# Patient Record
Sex: Female | Born: 1985 | Hispanic: Yes | Marital: Single | Smoking: Never smoker
Health system: Southern US, Community
[De-identification: ages and names within clinical notes are randomized; demographics above are authoritative.]

## PROBLEM LIST (undated history)

## (undated) ENCOUNTER — Inpatient Hospital Stay (HOSPITAL_COMMUNITY): Payer: Self-pay

## (undated) DIAGNOSIS — Z789 Other specified health status: Secondary | ICD-10-CM

## (undated) HISTORY — PX: NO PAST SURGERIES: SHX2092

---

## 2016-06-30 ENCOUNTER — Encounter (HOSPITAL_COMMUNITY): Payer: Self-pay | Admitting: *Deleted

## 2016-06-30 ENCOUNTER — Inpatient Hospital Stay (HOSPITAL_COMMUNITY)
Admission: AD | Admit: 2016-06-30 | Discharge: 2016-07-01 | Disposition: A | Discharge status: Home / Self Care | Payer: Self-pay | Source: Ambulatory Visit | Attending: Family Medicine | Admitting: Family Medicine

## 2016-06-30 DIAGNOSIS — O209 Hemorrhage in early pregnancy, unspecified: Secondary | ICD-10-CM

## 2016-06-30 DIAGNOSIS — Z3A12 12 weeks gestation of pregnancy: Secondary | ICD-10-CM | POA: Insufficient documentation

## 2016-06-30 DIAGNOSIS — O208 Other hemorrhage in early pregnancy: Secondary | ICD-10-CM | POA: Insufficient documentation

## 2016-06-30 DIAGNOSIS — O418X10 Other specified disorders of amniotic fluid and membranes, first trimester, not applicable or unspecified: Secondary | ICD-10-CM

## 2016-06-30 DIAGNOSIS — O468X1 Other antepartum hemorrhage, first trimester: Secondary | ICD-10-CM

## 2016-06-30 NOTE — MAU Note (Signed)
PT WENT  TO [facility]  ON 8-25  FOR  BLEEDING    TOLD  PT  SHE WAS PREG-   HAD U/S     HAD - TWINS  - BUT    1  NO FHR.      SAYS IS  BLEEDING  NOW-  WORSE   .  IN TRIAGE  -  PAD  SMALL  LIGHT  RED  STREAK  .  FEELS  CRAMPING -   STARTED   9PM.      DID NOT  GO BACK  TO [facility] - [facility] -  DID NOT  LIKE  TREATMENT  .

## 2016-07-01 ENCOUNTER — Inpatient Hospital Stay (HOSPITAL_COMMUNITY): Payer: Self-pay

## 2016-07-01 ENCOUNTER — Encounter (HOSPITAL_COMMUNITY): Payer: Self-pay | Admitting: Advanced Practice Midwife

## 2016-07-01 DIAGNOSIS — O468X1 Other antepartum hemorrhage, first trimester: Secondary | ICD-10-CM

## 2016-07-01 LAB — URINALYSIS, ROUTINE W REFLEX MICROSCOPIC
BILIRUBIN URINE: NEGATIVE
Glucose, UA: NEGATIVE mg/dL
Ketones, ur: NEGATIVE mg/dL
Leukocytes, UA: NEGATIVE
NITRITE: NEGATIVE
PH: 6 (ref 5.0–8.0)
Protein, ur: NEGATIVE mg/dL
SPECIFIC GRAVITY, URINE: 1.005 (ref 1.005–1.030)

## 2016-07-01 LAB — CBC
HEMATOCRIT: 35.1 % — AB (ref 36.0–46.0)
Hemoglobin: 12.5 g/dL (ref 12.0–15.0)
MCH: 31.3 pg (ref 26.0–34.0)
MCHC: 35.6 g/dL (ref 30.0–36.0)
MCV: 88 fL (ref 78.0–100.0)
PLATELETS: 270 10*3/uL (ref 150–400)
RBC: 3.99 MIL/uL (ref 3.87–5.11)
RDW: 13 % (ref 11.5–15.5)
WBC: 9.1 10*3/uL (ref 4.0–10.5)

## 2016-07-01 LAB — URINE MICROSCOPIC-ADD ON

## 2016-07-01 LAB — WET PREP, GENITAL
CLUE CELLS WET PREP: NONE SEEN
Sperm: NONE SEEN
TRICH WET PREP: NONE SEEN
WBC, Wet Prep HPF POC: NONE SEEN
YEAST WET PREP: NONE SEEN

## 2016-07-01 LAB — ABO/RH: ABO/RH(D): O POS

## 2016-07-01 MED ORDER — PRENATAL VITAMIN 27-0.8 MG PO TABS: 1.0000 | ORAL_TABLET | Freq: Every day | ORAL | 12 refills | Status: AC

## 2016-07-01 NOTE — MAU Provider Note (Signed)
Chief Complaint: Vaginal Bleeding   First Provider Initiated Contact with Patient 07/01/16 0031     SUBJECTIVE HPI: Nancy Bartlett is a 30 y.o. G3P2002 at approximately 12 weeks 4 days (per patient per ultrasound at [facility])  who presents to Maternity Admissions reporting heavy vaginal bleeding and mild cramping. States she was seen at [facility] on 06/19/2016 for vaginal bleeding and had an ultrasound showing one live baby and one empty gestational sac. Started having the heavy vaginal bleeding tonight and past a large clot. Isn't sure she passed any tissue. Worried that she may have miscarried.  Location: Suprapubic Quality: Cramping Severity: Mild Duration: Several hours Course: Improving Timing: Intermittent Modifying factors: Nothing. Hasn't tried anything to treat the pain. Improving spontaneously. Associated signs and symptoms: Positive for vaginal bleeding and passing clots. Negative for fever, chills, urinary complaints, GI complaints. Unsure if she has passed any tissue.  History reviewed. No pertinent past medical history. OB History  Gravida Para Term Preterm AB Living  3 2 2     2   SAB TAB Ectopic Multiple Live Births          2    # Outcome Date GA Lbr Len/2nd Weight Sex Delivery Anes PTL Lv  3 Current           2 Term      Vag-Spont   LIV  1 Term      Vag-Spont   LIV     History reviewed. No pertinent surgical history. Social History   Social History  . Marital status: Single    Spouse name: N/A  . Number of children: N/A  . Years of education: N/A   Occupational History  . Not on file.   Social History Main Topics  . Smoking status: Never Smoker  . Smokeless tobacco: Never Used  . Alcohol use No  . Drug use: No  . Sexual activity: Yes    Birth control/ protection: None   Other Topics Concern  . Not on file   Social History Narrative  . No narrative on file   No current facility-administered medications on file prior to encounter.    No  current outpatient prescriptions on file prior to encounter.   No Known Allergies  I have reviewed the past Medical Hx, Surgical Hx, Social Hx, Allergies and Medications.   Review of Systems  OBJECTIVE Patient Vitals for the past 24 hrs:  BP Temp Temp src Pulse Resp Height Weight  06/30/16 2300 116/79 98.3 F (36.8 C) Oral 81 20 5\' 2"  (1.575 m) 150 lb (68 kg)   Constitutional: Well-developed, well-nourished female in no acute distress. Anxious. Cardiovascular: normal rate Respiratory: normal rate and effort.  GI: Abd soft, non-tender. Gravid, size equals dates. MS: Extremities nontender, no edema, normal ROM Neurologic: Alert and oriented x 4.  GU:  SPECULUM EXAM: NEFG, small amount of dark red blood noted, cervix clean  BIMANUAL: cervix closed; uterus 14-week size, no adnexal tenderness or masses.  No CMT. Bladder tender.  LAB RESULTS Results for orders placed or performed during the hospital encounter of 06/30/16 (from the past 24 hour(s))  Urinalysis, Routine w reflex microscopic (not at [facility])     Status: Abnormal   Collection Time: 06/30/16 11:02 PM  Result Value Ref Range   Color, Urine YELLOW YELLOW   APPearance CLEAR CLEAR   Specific Gravity, Urine 1.005 1.005 - 1.030   pH 6.0 5.0 - 8.0   Glucose, UA NEGATIVE NEGATIVE mg/dL   Hgb urine  dipstick LARGE (A) NEGATIVE   Bilirubin Urine NEGATIVE NEGATIVE   Ketones, ur NEGATIVE NEGATIVE mg/dL   Protein, ur NEGATIVE NEGATIVE mg/dL   Nitrite NEGATIVE NEGATIVE   Leukocytes, UA NEGATIVE NEGATIVE  Urine microscopic-add on     Status: Abnormal   Collection Time: 06/30/16 11:02 PM  Result Value Ref Range   Squamous Epithelial / LPF 0-5 (A) NONE SEEN   WBC, UA 0-5 0 - 5 WBC/hpf   RBC / HPF 6-30 0 - 5 RBC/hpf   Bacteria, UA FEW (A) NONE SEEN  CBC     Status: Abnormal   Collection Time: 07/01/16 12:40 AM  Result Value Ref Range   WBC 9.1 4.0 - 10.5 K/uL   RBC 3.99 3.87 - 5.11 MIL/uL   Hemoglobin 12.5 12.0 - 15.0 g/dL   HCT  62.935.1 (L) 52.836.0 - 46.0 %   MCV 88.0 78.0 - 100.0 fL   MCH 31.3 26.0 - 34.0 pg   MCHC 35.6 30.0 - 36.0 g/dL   RDW 41.313.0 24.411.5 - 01.015.5 %   Platelets 270 150 - 400 K/uL  ABO/Rh     Status: None (Preliminary result)   Collection Time: 07/01/16 12:40 AM  Result Value Ref Range   ABO/RH(D) O POS     IMAGING [place] Ob Comp Less 14 Wks  Result Date: 07/01/2016 CLINICAL DATA:  30 year old pregnant female with vaginal bleeding EXAM: OBSTETRIC <14 WK US AND TRANSVAGINAL OB US TECHNIQUE: Both transabdominal and transvaginal ultrasound examinations were performed for complete evaluation of the gestation as well as the maternal uterus, adnexal regions, and pelvic cul-de-sac. Transvaginal technique was performed to assess early pregnancy. COMPARISON:  None for this pregnancy FINDINGS: Intrauterine gestational sac: Single intrauterine gestational sac Yolk sac:  Not seen Embryo:  Present Cardiac Activity: Detected Heart Rate: 153  bpm CRL:  7  mm   13 w   0 d                  US EDC: 01/06/2017 Subchorionic hemorrhage: There is a 3.4 x 1.8 x 3.2 cm subchorionic hemorrhage to the left and inferior aspect of the gestational sac. Maternal uterus/adnexae: There are 2 intramural fibroids in the inferior aspect of the uterus measuring up to 1.5 x 1.1 cm. The maternal ovaries appear unremarkable. There is a 1.3 x 1.0 x 1.2 cm complex cyst or corpus luteum in the right ovary. IMPRESSION: Single live intrauterine pregnancy with an estimated gestational age of [redacted] weeks, 0 days. Moderate subchorionic hemorrhage.  Follow-up recommended. Electronically Signed   By: Elgie CollardArash  Radparvar M.D.   On: 07/01/2016 01:35   [place] Ob Transvaginal  Result Date: 07/01/2016 CLINICAL DATA:  30 year old pregnant female with vaginal bleeding EXAM: OBSTETRIC <14 WK US AND TRANSVAGINAL OB US TECHNIQUE: Both transabdominal and transvaginal ultrasound examinations were performed for complete evaluation of the gestation as well as the maternal uterus, adnexal  regions, and pelvic cul-de-sac. Transvaginal technique was performed to assess early pregnancy. COMPARISON:  None for this pregnancy FINDINGS: Intrauterine gestational sac: Single intrauterine gestational sac Yolk sac:  Not seen Embryo:  Present Cardiac Activity: Detected Heart Rate: 153  bpm CRL:  7  mm   13 w   0 d                  US EDC: 01/06/2017 Subchorionic hemorrhage: There is a 3.4 x 1.8 x 3.2 cm subchorionic hemorrhage to the left and inferior aspect of the gestational sac. Maternal uterus/adnexae: There are 2  intramural fibroids in the inferior aspect of the uterus measuring up to 1.5 x 1.1 cm. The maternal ovaries appear unremarkable. There is a 1.3 x 1.0 x 1.2 cm complex cyst or corpus luteum in the right ovary. IMPRESSION: Single live intrauterine pregnancy with an estimated gestational age of [redacted] weeks, 0 days. Moderate subchorionic hemorrhage.  Follow-up recommended. Electronically Signed   By: Elgie Collard M.D.   On: 07/01/2016 01:35    MAU COURSE Previous ultrasound Records unavailable for review . CBC, ABO/Rh, ultrasound, wet prep and GC/chlamydia culture, UA  MDM - Pain and bleeding in early pregnancy with normal intrauterine pregnancy and hemodynamically stable. Symptoms likely due to subchorionic hemorrhage. No evidence of second gestational sac on today's ultrasound. Unsure if one was truly visualized or if subchorionic hemorrhage may have been mistaken for second sac.  ASSESSMENT 1. Subchorionic hemorrhage in first trimester   2. Vaginal bleeding in pregnancy, first trimester     PLAN Discharge home in stable condition. Bleeding precautions Pelvic rest 1 week. Comfort measures. Rx prenatal vitamin. GC/Chlamydia cultures pending. Will culture urine. Follow-up Information    Obstetrician of your choice .   Why:  Start prenatal care       [facility] [place] [facility] ADMISSIONS .   Why:  As needed in emergencies Contact information: [address] [id number] mc [place] [address] [phone]           Medication List    TAKE these medications   Prenatal Vitamin 27-0.8 MG Tabs Take 1 tablet by mouth daily.        [place], CNM 07/01/2016  3:08 AM  4

## 2016-07-01 NOTE — Discharge Instructions (Signed)
Hematoma subcoriónico °(Subchorionic Hematoma) °Un hematoma subcoriónico es una acumulación de sangre entre la pared externa de la placenta y la pared interna del la matriz (útero). La placenta es el órgano que conecta el feto a la pared del útero. La placenta realiza la función de alimentación, respiración (oxígeno al feto) y el trabajo de eliminación de desechos (excreción) del feto.  °Un hematoma subcoriónico es la anormalidad más frecuente encontrada en una ecografía durante el primer trimestre o principios del segundo trimestre del embarazo. Si ha habido poca o ninguna hemorragia vaginal, generalmente los pequeños hematomas se reducen por su propia cuenta y no afectan al bebé ni al embarazo. La sangre es absorbida gradualmente durante una o dos semanas. Cuando la hemorragia comienza más tarde en el embarazo o el hematoma es más grande o se produce en una paciente de edad avanzada, el resultado puede no ser tan bueno. Los grandes hematomas pueden agrandarse aún más y aumenta las posibilidades de aborto espontáneo. El hematoma subcoriónico también aumenta el riesgo de desprendimiento precoz de la placenta del útero, muerte fetal y parto prematuro. °INSTRUCCIONES PARA EL CUIDADO EN EL HOGAR  °· Repose en cama si el médico se lo recomienda. Aunque el reposo en cama no evitará la hemorragia o un aborto espontáneo, su médico puede recomendarlo. °· Evite levantar objetos pesados (más de 10 libras [4,5 kg]), hacer ejercicio, tener relaciones sexuales o realizar duchas vaginales según se lo indique el profesional. °· Lleve un registro de la cantidad y grado de remojo (saturación) de las toallas higiénicas que utiliza cada día. Anote esta información. °· No use tampones. °· Cumpla con todas las visitas de control, según le indique su médico. El profesional podrá pedirle que se realice análisis de seguimiento, pruebas de ultrasonido o ambas. °SOLICITE ATENCIÓN MÉDICA DE INMEDIATO SI:  °· Siente calambres intensos en el  estómago, en la espalda, en el abdomen o en la pelvis. °· Tiene fiebre. °· Elimina coágulos o tejidos grandes. Guarde los tejidos para que su médico los vea. °· Si la hemorragia aumenta o siente mareos, debilidad o tiene episodios de desmayos. °  °Esta información no tiene como fin reemplazar el consejo del médico. Asegúrese de hacerle al médico cualquier pregunta que tenga. °  °Document Released: 01/28/2009 Document Revised: 08/02/2013 °Elsevier Interactive Patient Education ©2016 Elsevier Inc. ° °Reposo pélvico  °(Pelvic Rest) °El reposo pélvico se recomienda a las mujeres cuando:  °· La placenta cubre parcial o completamente la abertura del cuello del útero (placenta previa). °· Hay sangrado entre la pared del útero y el saco amniótico en el primer trimestre (hemorragia subcoriónica). °· El cuello uterino comienza a abrirse sin iniciarse el trabajo de parto (cuello uterino incompetente, insuficiencia cervical). °· El trabajo de parto se inicia muy pronto (parto prematuro). °INSTRUCCIONES PARA EL CUIDADO EN EL HOGAR  °· No tenga relaciones sexuales, estimulación, ni orgasmos. °· No use tampones, no se haga duchas vaginales ni coloque ningún objeto en la vagina. °· No levante objetos que pesen más de 10 libras (4,5 kg). °· Evite las actividades extenuantes o tensionar los músculos de la pelvis. °SOLICITE ATENCIÓN MÉDICA SI:   °· Tiene un sangrado vaginal durante el embarazo. Considérelo como una posible emergencia. °· Siente cólicos en la zona baja del estómago (más fuertes que los cólicos menstruales). °· Nota flujo vaginal (acuoso, con moco o sangre). °· Siente un dolor en la espalda leve y sordo. °· Tiene contracciones regulares o endurecimiento del útero. °SOLICITE ATENCIÓN MÉDICA DE INMEDIATO SI:  °Observa sangrado   vaginal y tiene placenta previa.  °  °Esta información no tiene como fin reemplazar el consejo del médico. Asegúrese de hacerle al médico cualquier pregunta que tenga. °  °Document Released:  07/06/2012 °Elsevier Interactive Patient Education ©2016 Elsevier Inc. ° °

## 2016-07-02 LAB — GC/CHLAMYDIA PROBE AMP (~~LOC~~) NOT AT ARMC
CHLAMYDIA, DNA PROBE: NEGATIVE
Neisseria Gonorrhea: NEGATIVE

## 2016-07-03 LAB — OB RESULTS CONSOLE HIV ANTIBODY (ROUTINE TESTING): HIV: NONREACTIVE

## 2016-07-03 LAB — OB RESULTS CONSOLE HEPATITIS B SURFACE ANTIGEN: HEP B S AG: NEGATIVE

## 2016-07-03 LAB — OB RESULTS CONSOLE RPR: RPR: NONREACTIVE

## 2016-07-03 LAB — OB RESULTS CONSOLE ANTIBODY SCREEN: Antibody Screen: NEGATIVE

## 2016-07-03 LAB — OB RESULTS CONSOLE RUBELLA ANTIBODY, IGM: RUBELLA: IMMUNE

## 2016-07-03 LAB — OB RESULTS CONSOLE ABO/RH: RH TYPE: POSITIVE

## 2016-08-18 ENCOUNTER — Observation Stay (HOSPITAL_COMMUNITY)
Admission: AD | Admit: 2016-08-18 | Discharge: 2016-08-20 | Disposition: A | Discharge status: Home / Self Care | Payer: Medicaid Other | Source: Ambulatory Visit | Attending: Obstetrics & Gynecology | Admitting: Obstetrics & Gynecology

## 2016-08-18 ENCOUNTER — Encounter (HOSPITAL_COMMUNITY): Payer: Self-pay

## 2016-08-18 ENCOUNTER — Ambulatory Visit (HOSPITAL_COMMUNITY)
Admission: RE | Admit: 2016-08-18 | Discharge: 2016-08-18 | Disposition: A | Discharge status: Home / Self Care | Payer: Medicaid Other | Source: Ambulatory Visit | Attending: Specialist | Admitting: Specialist

## 2016-08-18 ENCOUNTER — Other Ambulatory Visit (HOSPITAL_COMMUNITY): Payer: Self-pay | Admitting: Specialist

## 2016-08-18 ENCOUNTER — Encounter (HOSPITAL_COMMUNITY): Payer: Self-pay | Admitting: *Deleted

## 2016-08-18 DIAGNOSIS — O0339 Incomplete spontaneous abortion with other complications: Secondary | ICD-10-CM | POA: Diagnosis not present

## 2016-08-18 DIAGNOSIS — O200 Threatened abortion: Secondary | ICD-10-CM | POA: Diagnosis present

## 2016-08-18 DIAGNOSIS — O42912 Preterm premature rupture of membranes, unspecified as to length of time between rupture and onset of labor, second trimester: Secondary | ICD-10-CM | POA: Diagnosis present

## 2016-08-18 DIAGNOSIS — O99820 Streptococcus B carrier state complicating pregnancy: Secondary | ICD-10-CM

## 2016-08-18 DIAGNOSIS — Z363 Encounter for antenatal screening for malformations: Secondary | ICD-10-CM | POA: Insufficient documentation

## 2016-08-18 DIAGNOSIS — O4102X0 Oligohydramnios, second trimester, not applicable or unspecified: Secondary | ICD-10-CM

## 2016-08-18 DIAGNOSIS — O42012 Preterm premature rupture of membranes, onset of labor within 24 hours of rupture, second trimester: Secondary | ICD-10-CM

## 2016-08-18 DIAGNOSIS — Z3A19 19 weeks gestation of pregnancy: Secondary | ICD-10-CM | POA: Insufficient documentation

## 2016-08-18 DIAGNOSIS — O42919 Preterm premature rupture of membranes, unspecified as to length of time between rupture and onset of labor, unspecified trimester: Secondary | ICD-10-CM

## 2016-08-18 DIAGNOSIS — Z1389 Encounter for screening for other disorder: Secondary | ICD-10-CM

## 2016-08-18 DIAGNOSIS — N939 Abnormal uterine and vaginal bleeding, unspecified: Secondary | ICD-10-CM | POA: Diagnosis present

## 2016-08-18 HISTORY — DX: Other specified health status: Z78.9

## 2016-08-18 LAB — WET PREP, GENITAL
CLUE CELLS WET PREP: NONE SEEN
Sperm: NONE SEEN
Trich, Wet Prep: NONE SEEN
YEAST WET PREP: NONE SEEN

## 2016-08-18 LAB — CBC
HEMATOCRIT: 37.9 % (ref 36.0–46.0)
Hemoglobin: 13.2 g/dL (ref 12.0–15.0)
MCH: 31.1 pg (ref 26.0–34.0)
MCHC: 34.8 g/dL (ref 30.0–36.0)
MCV: 89.2 fL (ref 78.0–100.0)
PLATELETS: 276 10*3/uL (ref 150–400)
RBC: 4.25 MIL/uL (ref 3.87–5.11)
RDW: 13.2 % (ref 11.5–15.5)
WBC: 8.3 10*3/uL (ref 4.0–10.5)

## 2016-08-18 LAB — TYPE AND SCREEN
ABO/RH(D): O POS
ANTIBODY SCREEN: NEGATIVE

## 2016-08-18 LAB — RAPID HIV SCREEN (HIV 1/2 AB+AG)
HIV 1/2 Antibodies: NONREACTIVE
HIV-1 P24 Antigen - HIV24: NONREACTIVE

## 2016-08-18 LAB — KLEIHAUER-BETKE STAIN
# VIALS RHIG: 1
Fetal Cells %: 0 %
QUANTITATION FETAL HEMOGLOBIN: 0 mL

## 2016-08-18 MED ORDER — LACTATED RINGERS IV SOLN: INTRAVENOUS | Status: DC

## 2016-08-18 MED ORDER — OXYTOCIN BOLUS FROM INFUSION: 500.0000 mL | Freq: Once | INTRAVENOUS | Status: DC

## 2016-08-18 MED ORDER — OXYTOCIN 40 UNITS IN LACTATED RINGERS INFUSION - SIMPLE MED
2.5000 [IU]/h | INTRAVENOUS | Status: DC
  Filled 2016-08-18: qty 1000

## 2016-08-18 MED ORDER — FENTANYL CITRATE (PF) 100 MCG/2ML IJ SOLN
50.0000 ug | INTRAMUSCULAR | Status: AC | PRN
  Administered 2016-08-18 – 2016-08-19 (×3): 50 ug via INTRAVENOUS
  Filled 2016-08-18 (×3): qty 2

## 2016-08-18 MED ORDER — FENTANYL CITRATE (PF) 100 MCG/2ML IJ SOLN
50.0000 ug | Freq: Once | INTRAMUSCULAR | Status: AC
  Administered 2016-08-18: 50 ug via INTRAVENOUS
  Filled 2016-08-18: qty 2

## 2016-08-18 MED ORDER — LACTATED RINGERS IV SOLN: 500.0000 mL | INTRAVENOUS | Status: DC | PRN

## 2016-08-18 MED ORDER — LIDOCAINE HCL (PF) 1 % IJ SOLN
30.0000 mL | INTRAMUSCULAR | Status: DC | PRN
  Filled 2016-08-18: qty 30

## 2016-08-18 MED ORDER — SOD CITRATE-CITRIC ACID 500-334 MG/5ML PO SOLN: 30.0000 mL | ORAL | Status: DC | PRN

## 2016-08-18 MED ORDER — ONDANSETRON HCL 4 MG/2ML IJ SOLN: 4.0000 mg | Freq: Four times a day (QID) | INTRAMUSCULAR | Status: DC | PRN

## 2016-08-18 MED ORDER — LACTATED RINGERS IV SOLN
INTRAVENOUS | Status: DC
  Administered 2016-08-18 (×2): via INTRAVENOUS

## 2016-08-18 NOTE — MAU Note (Signed)
Pt brought to MAU from MFM; diagnosed with PPROM yesterday; having heavy bleeding;

## 2016-08-18 NOTE — H&P (Signed)
Obstetrics Admission History & Physical  08/18/2016 - 3:02 PM   Late entry for 1338  Primary OBGYN: Other Dr. Shawnie Ponsorn [facility]  Chief Complaint: VB, abdominal and low back discomfort, anhydramnios  History of Present Illness  30 y.o. [id number] @ 2018w6d, with the above CC. Pregnancy complicated by: vaginal bleeding and spotting during the pregnancy.  Patient is with Dr. Tawni Levyorn's practice and part of the HPI was obtained from her and her family and Dr. Shawnie Ponsorn, from talking to him. He states that she has been seen at multiple hospital in the region for continued VB and spotting and had been followed closely by his practice and was diagnosed with PPROM yesterday on spec exam and u/s; she had declined any genetic screening. She states she was put on abx by Dr. Shawnie Ponsorn yesterday. Given the diagnosis, she was referred to MFM for u/s today, which she obtained earlier today.  MFM u/s today shows SLIUP with anhydramnios and anterior placenta and no previa; a formal anatomy scan wasn't done. Dr. Claudean SeveranceWhitecar referred her to MAU for further management given to the fetus (cephalic) abutting the internal os. He stated it was hard to tell about the cervix.  On presentation to MAU, patient was having some abdominal and low back discomfort with VB. On speculum exam, small to moderate amount of blood cleared out with two fox swabs and cervix was digitally dilated to 2cm per Judeth HornErin Lawrence NP  Currently, patient denies any abdominal or back pain and she just changed her pad. She also declines any fevers, chills, chest pain, SOB or uterine contractions.   Review of Systems: her 12 point review of systems is negative or as noted in the History of Present Illness.   PMHx:  Past Medical History:  Diagnosis Date  . Medical history non-contributory    PSHx:  Past Surgical History:  Procedure Laterality Date  . NO PAST SURGERIES     Medications:  Prescriptions Prior to Admission  Medication Sig Dispense Refill Last Dose   . acetaminophen (TYLENOL) 500 MG tablet Take 1,000 mg by mouth every 6 (six) hours as needed for mild pain, moderate pain or headache.   Past Week at Unknown time  . amoxicillin (AMOXIL) 500 MG capsule Take 500 mg by mouth 3 (three) times daily.   08/18/2016 at Unknown time  . Prenatal Vit-Fe Fumarate-FA (PRENATAL VITAMIN) 27-0.8 MG TABS Take 1 tablet by mouth daily. 30 tablet 12 08/17/2016 at Unknown time     Allergies: has No Known Allergies. OBHx:  OB History  Gravida Para Term Preterm AB Living  3 2 2     2   SAB TAB Ectopic Multiple Live Births          2    # Outcome Date GA Lbr Len/2nd Weight Sex Delivery Anes PTL Lv  3 Current           2 Term      Vag-Spont   LIV  1 Term      Vag-Spont   LIV              FHx:  Family History  Problem Relation Age of Onset  . Alcohol abuse Neg Hx   . Arthritis Neg Hx   . Asthma Neg Hx   . Birth defects Neg Hx   . Cancer Neg Hx   . COPD Neg Hx   . Depression Neg Hx   . Diabetes Neg Hx   . Drug abuse Neg Hx   .  Early death Neg Hx   . Hearing loss Neg Hx   . Heart disease Neg Hx   . Hyperlipidemia Neg Hx   . Hypertension Neg Hx   . Kidney disease Neg Hx   . Learning disabilities Neg Hx   . Mental illness Neg Hx   . Mental retardation Neg Hx   . Miscarriages / Stillbirths Neg Hx   . Stroke Neg Hx   . Vision loss Neg Hx   . Varicose Veins Neg Hx    Soc Hx:  Social History   Social History  . Marital status: Single    Spouse name: N/A  . Number of children: N/A  . Years of education: N/A   Occupational History  . Not on file.   Social History Main Topics  . Smoking status: Never Smoker  . Smokeless tobacco: Never Used  . Alcohol use No  . Drug use: No  . Sexual activity: Yes    Birth control/ protection: None   Other Topics Concern  . Not on file   Social History Narrative  . No narrative on file    Objective  There were no vitals filed for this visit. Pulse Rate:  [92] 92 (10/24 0939) BP: (116)/(76)  116/76 (10/24 0939) Weight:  [70.6 kg (155 lb 9.6 oz)] 70.6 kg (155 lb 9.6 oz) (10/24 0939) No data recorded.  No intake or output data in the 24 hours ending 08/18/16 1502   General: Well nourished, well developed female in no acute distress.  Skin:  Warm and dry.  Cardiovascular: S1, S2 normal, no murmur, rub or gallop, regular rate and rhythm Respiratory:  Clear to auscultation bilateral. Normal respiratory effort Abdomen: soft, nttp, gravid Neuro/Psych:  Normal mood and affect.   GU: new pad with approx 4 x 4cm lightly brightly blood stained area. No blood at the introitus and no active bleeding  Labs  O POS  Recent Labs Lab 08/18/16 1250  WBC 8.3  HGB 13.2  HCT 37.9  PLT 276   Negative: KB, HIV, wet prep (only a few WBCs) Pending: RPR, GC/CT  Radiology As above.   Assessment & Plan   30 y.o. [id number] @ [redacted]w[redacted]d with PPROM  *PPROM: long d/w patient re: diagnosis and prognosis and DDx includes infectious (d/w pt was done prior to CBC coming back), vs PTL with inevitable AB. I told her that I suspect that she has an inevitable AB and is PTL and that once labs are back (see above) that I would come talk to her and husband who is en route about plan of care. Regardless, I told her that I'd recommend observation and depending on her course and labs, offer augmentation with FHTs being done every few hours. Pt to stay NPO until disposition is made and labs are back.   Interpreter used  Verlot Bing, Montez Hageman. MD Attending [facility] [employer] [facility])

## 2016-08-18 NOTE — ED Notes (Signed)
Pt reports leaking reddish/clear watery fluid x 3 months.

## 2016-08-18 NOTE — MAU Provider Note (Signed)
History     CSN: [id number]  Arrival date and time: 08/18/16 1055   First Provider Initiated Contact with Patient 08/18/16 1213      Chief Complaint  Patient presents with  . Abdominal Pain  . Rupture of Membranes  . Vaginal Bleeding   HPI Nancy Bartlett is a 30 y.o. G3P2002 at 7963w6d who presents with abdominal pain & vaginal bleeding. [place] [place] [place]. Was complaining of leaking pink tinged fluid for the last 3 months. Was seen by Dr. Shawnie Ponsorn yesterday & told she was ruptured; sent to MFM today for ultrasound. Pt sent to MAU today by MFM for anhydramnios and possible labor.  Patient reports intermittent abdominal pain since last night that has worsened today. Rates pain 8/10. Has no treated pain. Also reports vaginal bleeding since last week that has become heavier today.  Denies fever/chills.   OB History    Gravida Para Term Preterm AB Living   3 2 2     2    SAB TAB Ectopic Multiple Live Births           2      Past Medical History:  Diagnosis Date  . Medical history non-contributory     Past Surgical History:  Procedure Laterality Date  . NO PAST SURGERIES      Family History  Problem Relation Age of Onset  . Alcohol abuse Neg Hx   . Arthritis Neg Hx   . Asthma Neg Hx   . Birth defects Neg Hx   . Cancer Neg Hx   . COPD Neg Hx   . Depression Neg Hx   . Diabetes Neg Hx   . Drug abuse Neg Hx   . Early death Neg Hx   . Hearing loss Neg Hx   . Heart disease Neg Hx   . Hyperlipidemia Neg Hx   . Hypertension Neg Hx   . Kidney disease Neg Hx   . Learning disabilities Neg Hx   . Mental illness Neg Hx   . Mental retardation Neg Hx   . Miscarriages / Stillbirths Neg Hx   . Stroke Neg Hx   . Vision loss Neg Hx   . Varicose Veins Neg Hx     Social History  Substance Use Topics  . Smoking status: Never Smoker  . Smokeless tobacco: Never Used  . Alcohol use No    Allergies: No Known Allergies  Prescriptions  Prior to Admission  Medication Sig Dispense Refill Last Dose  . Prenatal Vit-Fe Fumarate-FA (PRENATAL VITAMIN) 27-0.8 MG TABS Take 1 tablet by mouth daily. 30 tablet 12 Taking    Review of Systems  Constitutional: Negative.   Gastrointestinal: Positive for abdominal pain. Negative for constipation, diarrhea, nausea and vomiting.  Genitourinary: Negative for dysuria.       + vaginal bleeding + LOF -- pink tinged x 3 months   Physical Exam   Last menstrual period 12/24/2015.  Physical Exam  Nursing note and vitals reviewed. Constitutional: She is oriented to person, place, and time. She appears well-developed and well-nourished. No distress.  HENT:  Head: Normocephalic and atraumatic.  Eyes: Conjunctivae are normal. Right eye exhibits no discharge. Left eye exhibits no discharge. No scleral icterus.  Neck: Normal range of motion.  Cardiovascular: Normal rate.   Respiratory: Effort normal. No respiratory distress.  Genitourinary: There is bleeding (small to moderate amount of blood cleared out with 2 fox swabs) in the vagina.  Genitourinary Comments:  Cervix dilated 2 cm  Neurological: She is alert and oriented to person, place, and time.  Skin: Skin is warm and dry. She is not diaphoretic.  Psychiatric: She has a normal mood and affect. Her behavior is normal. Judgment and thought content normal.   Dilation: 2 Effacement (%): 50 Cervical Position: [place]: -3 Exam by:: Judeth Horn NP   MAU Course  Procedures Results for orders placed or performed during the hospital encounter of 08/18/16 (from the past 24 hour(s))  Wet prep, genital     Status: Abnormal   Collection Time: 08/18/16 12:32 PM  Result Value Ref Range   Yeast Wet Prep HPF POC NONE SEEN NONE SEEN   Trich, Wet Prep NONE SEEN NONE SEEN   Clue Cells Wet Prep HPF POC NONE SEEN NONE SEEN   WBC, Wet Prep HPF POC FEW (A) NONE SEEN   Sperm NONE SEEN     MDM Ultrasound shows cardiac activity & anhydramnios.  Fetal head appears in cervix & pt was sent to MAU for possible imminent delivery CBC, HIV, type & screen, RPR, KB Urine culture  GC/Ct & wet prep collected IV started & fentanyl 50 mcg ordered S/w Dr. Vergie Living; will come see pt in MAU with plan to admit to birthing suites  Assessment and Plan  A: 1. Preterm premature rupture of membranes (PPROM) with unknown onset of labor   2. Anhydramnios in second trimester, single or unspecified fetus    P: Dr. Vergie Living in to speak with patient regarding plan to admit   Judeth Horn 08/18/2016, 12:08 PM

## 2016-08-18 NOTE — Progress Notes (Addendum)
L&D Note  08/18/2016 - 4:36 PM  30 y.o. [id number] 10342w6d. Pregnancy complicated by recurrent VB during the pregnancy  Ms. Nancy Bartlett is admitted for pre-viable PPROM management   Subjective:  Feeling some more discomfort (8/10). VB is minimal.  Objective:   Vitals:   08/18/16 1300 08/18/16 1511  BP: 110/70 109/67  Pulse: 86 84  Resp: 18 18  Temp: 98.1 F (36.7 C) 98.2 F (36.8 C)  TempSrc: Oral Oral    Current Vital Signs 24h Vital Sign Ranges  T 98.2 F (36.8 C) Temp  Avg: 98.2 F (36.8 C)  Min: 98.1 F (36.7 C)  Max: 98.2 F (36.8 C)  BP 109/67 BP  Min: 109/67  Max: 116/76  HR 84 Pulse  Avg: 87.3  Min: 84  Max: 92  RR 18 Resp  Avg: 18  Min: 18  Max: 18  SaO2     No Data Recorded       24 Hour I/O Current Shift I/O  Time Ins Outs No intake/output data recorded. No intake/output data recorded.   FHTs: 150s-160s Gen: NAD SVE: 5/75/0 with fetal parts at the os  Labs:   Recent Labs Lab 08/18/16 1250  WBC 8.3  HGB 13.2  HCT 37.9  PLT 276   O POS  Medications Current Facility-Administered Medications  Medication Dose Route Frequency Provider Last Rate Last Dose  . lactated ringers infusion 500-1,000 mL  500-1,000 mL Intravenous PRN [place] Bingharlie Dvora Buitron, MD      . lactated ringers infusion   Intravenous Continuous Judeth HornErin Lawrence, NP 125 mL/hr at 08/18/16 1317    . lactated ringers infusion   Intravenous Continuous [place] Bingharlie Praveen Coia, MD      . lidocaine (PF) (XYLOCAINE) 1 % injection 30 mL  30 mL Subcutaneous PRN [place] Bingharlie Isamar Wellbrock, MD      . ondansetron (ZOFRAN) injection 4 mg  4 mg Intravenous Q6H PRN [place] Bingharlie Dayshawn Irizarry, MD      . oxytocin (PITOCIN) IV BOLUS FROM BAG  500 mL Intravenous Once [place] Bingharlie Candi Profit, MD      . oxytocin (PITOCIN) IV infusion 40 units in LR 1000 mL - Premix  2.5 Units/hr Intravenous Continuous [place] Bingharlie Nastashia Gallo, MD      . sodium citrate-citric acid (ORACIT) solution 30 mL  30 mL Oral Q2H PRN  Bingharlie Avigayil Ton, MD        Assessment & Plan:   Patient with continued cervical change Given cx change, I told her that I again recommend augmentation for inevitable AB. No e/o infection. Husband is on his way  Interpreter used.   Cornelia Copaharlie Jamoni Hewes, Jr. MD Attending Center for [facility](Faculty Practice)   ADDENDUM @ [phone] stituation d/w her and husband and family re: recommendation for augmentation for inevitable AB. Risk of D&C d/w them for POCs and analgesia options also d/w them. Once patient on the floor then we can start her PV cytotec. They are unsure if they'd like to see the baby after delivery.   NPO, MIVF.   Cornelia Copaharlie Giordano Getman, Jr MD Attending Center for [facility](Faculty Practice)

## 2016-08-18 NOTE — Consult Note (Signed)
Maternal Fetal Medicine Consultation  Requesting Provider(s): Arther Abbott, MD  Reason for consultation: suspected mid-trimester PROM  HPI: Nancy Bartlett is a 30 yo G3P2002, EDD 12/24/2015 who is currently at 19w 6d seen due to suspected PROM.  Ms. Kuch reports vaginal bleeding since early in the pregnancy.  She has an early ultrasound in the ED that was felt to be consistent with twin gestation - bleeding up to this point was felt to be secondary to "vanishing twin syndrome".  Ms. Intrieri was seen in clinic yesterday for an anatomy ultrasound - at that time was noted to have low fluid and a "small fetal head".  Sterile speculum exam was performed that was ferning positive.  She was started on po Amoxicillin and referred to MFM today for further evaluation and plans.  The patient reports some lower abdominal, back and vaginal pain.  She denies fever or chills.  She is known GBS positive per clinic records.  The patient's past OB history is remarkable for town previous term SVDs.  Her second pregnancy was complicated by midtrimester vaginal bleeding.  OB History: OB History    Gravida Para Term Preterm AB Living   3 2 2     2    SAB TAB Ectopic Multiple Live Births           2      PMH:  Past Medical History:  Diagnosis Date  . Medical history non-contributory     PSH:  Past Surgical History:  Procedure Laterality Date  . NO PAST SURGERIES     Meds:  Current Outpatient Prescriptions on File Prior to Encounter  Medication Sig Dispense Refill  . AMOXICILLIN PO Take by mouth.    . Prenatal Vit-Fe Fumarate-FA (PRENATAL VITAMIN) 27-0.8 MG TABS Take 1 tablet by mouth daily. 30 tablet 12   No current facility-administered medications on file prior to encounter.    Allergies: No Known Allergies   FH: Denies birth defects or hereditary disorders  Soc:  Social History   Social History  . Marital status: Single    Spouse name: N/A  . Number of children: N/A  . Years of  education: N/A   Occupational History  . Not on file.   Social History Main Topics  . Smoking status: Never Smoker  . Smokeless tobacco: Never Used  . Alcohol use No  . Drug use: No  . Sexual activity: Yes    Birth control/ protection: None   Other Topics Concern  . Not on file   Social History Narrative  . No narrative on file    PE:  155 lbs, 116/76, 92  GEN: well-appearing female ABD: gravid, NT  Ultrasound: Single IUP at 19w 6d by early ultrasound Anhydramnios A bladder ans small stomach bubble are appreciated Limited views of the fetal anatomy were obtained due to oligohydramnios No gross anomalies noted The fetal head appears to be in the cervix Anterior placenta without previa   A/P: 1) Single IUP at 19 6/7 weeks  2) Midtrimester PROM - had brief discussion regarding PROM and potential risks/ guarded prognosis for the fetus.  The patient and her family are aware that the threshold of viability is approximately [redacted] weeks gestation and that there is significant risk of delivery in the next 1-2 weeks (typical period of latency after PROM).  Reviewed risk of intra amniotic infection, placental abruption, risk of pulmonary hypoplasia even if she was able to achieve a gestational age where the fetus could potentially survive.  Given ultrasound findings, with the fetal head at the cervix and some lower abdominal pain I am concerned that she is at risk for imminent delivery - the patient was sent to MAU for further evaluation and will likely need to be admitted for observation.  In the event that the patient remains stable and does not have evidence of infection or at risk for imminent delivery, would offer expect management (if desired).  If she chooses this management option, would consider close outpatient management - check temperature at least 4x daily with labor precautions.  If she is undelivered, would consider admission at 22w 5d for course of betamethasone / NICU  consultation.  In general, I do not recommend latency antibiotics at this previable gestation, but would offer this intervention once viability is achieved.    Thank you for the opportunity to be a part of the care of Apache CorporationLourdes Noyola Woodell. Please contact our office if we can be of further assistance.   I spent approximately 30 minutes with this patient with over 50% of time spent in face-to-face counseling.  Alpha GulaPaul Florita Nitsch, MD Maternal Fetal Medicine

## 2016-08-19 ENCOUNTER — Encounter (HOSPITAL_COMMUNITY): Payer: Self-pay | Admitting: *Deleted

## 2016-08-19 ENCOUNTER — Other Ambulatory Visit (HOSPITAL_COMMUNITY): Payer: Self-pay

## 2016-08-19 DIAGNOSIS — O0339 Incomplete spontaneous abortion with other complications: Secondary | ICD-10-CM

## 2016-08-19 LAB — GC/CHLAMYDIA PROBE AMP (~~LOC~~) NOT AT ARMC
CHLAMYDIA, DNA PROBE: NEGATIVE
Neisseria Gonorrhea: NEGATIVE

## 2016-08-19 LAB — RPR: RPR Ser Ql: NONREACTIVE

## 2016-08-19 MED ORDER — OXYCODONE HCL 5 MG PO TABS: 5.0000 mg | ORAL_TABLET | ORAL | Status: DC | PRN

## 2016-08-19 MED ORDER — FENTANYL CITRATE (PF) 100 MCG/2ML IJ SOLN
100.0000 ug | Freq: Once | INTRAMUSCULAR | Status: AC
  Administered 2016-08-19: 100 ug via INTRAVENOUS
  Filled 2016-08-19: qty 2

## 2016-08-19 MED ORDER — MISOPROSTOL 200 MCG PO TABS
800.0000 ug | ORAL_TABLET | Freq: Once | ORAL | Status: AC
  Administered 2016-08-19: 800 ug via ORAL
  Filled 2016-08-19: qty 4

## 2016-08-19 MED ORDER — MISOPROSTOL 200 MCG PO TABS: 800.0000 ug | ORAL_TABLET | Freq: Once | ORAL | Status: DC | PRN

## 2016-08-19 MED ORDER — ACETAMINOPHEN 325 MG PO TABS
650.0000 mg | ORAL_TABLET | Freq: Four times a day (QID) | ORAL | Status: DC | PRN
Start: 2016-08-19 — End: 2016-08-20
  Administered 2016-08-20: 650 mg via ORAL
  Filled 2016-08-19: qty 2

## 2016-08-19 MED ORDER — BUTORPHANOL TARTRATE 1 MG/ML IJ SOLN
1.0000 mg | INTRAMUSCULAR | Status: DC | PRN
  Administered 2016-08-20: 1 mg via INTRAVENOUS
  Filled 2016-08-19: qty 1

## 2016-08-19 NOTE — Progress Notes (Signed)
Pt was sitting up, awake and alert, in bed when I arrived. Her husband was bedside.  They requested that their baby girl be baptized and called for an interpreter. With the interpreter, CH baptized baby using the Scripture text requested by parents. Pt was very tearful and her husband was very attentive. Both were appreciative. Please page if additional support is needed.  Chaplain Marjory LiesPamela Carrington Holder, M.Div.   08/19/16 1900  Clinical Encounter Type  Visit Type Death

## 2016-08-19 NOTE — Progress Notes (Signed)
Zaydah Pete Glatteroyola Eve is a 30 y.o. G3P2002 at 423w0d by ultrasound admitted for rupture of membranes, anhydramnios  Subjective:Has felt some movement, no bleeding now, few contractions   Objective: BP 102/60 (BP Location: Right Arm)   Pulse 94   Temp 98.6 F (37 C) (Oral)   Resp 16   LMP 12/24/2015   SpO2 100%  I/O last 3 completed shifts: In: -  Out: 350 [Urine:350] No intake/output data recorded.  FHT:  160 UC:   occasional SVE:   Dilation: 2 Effacement (%): 50 Station: -3 Exam by:: Olegario MessierKathy, L&D RROB RN  Labs: Lab Results  Component Value Date   WBC 8.3 08/18/2016   HGB 13.2 08/18/2016   HCT 37.9 08/18/2016   MCV 89.2 08/18/2016   PLT 276 08/18/2016    Assessment / Plan: 3423w0d Bleeding, ROM  Labor: observing for labor Preeclampsia:  no signs or symptoms of toxicity Fetal Wellbeing:  FHR noted, not viable GA Pain Control:  IV pain meds I/D:  n/a Anticipated MOD:  NSVD. Discussed with pt via interpreter that if no labor ori indication to terminate she could go home with close f/u  ARNOLD,JAMES 08/19/2016, 7:10 AM

## 2016-08-19 NOTE — Progress Notes (Signed)
I was present during the delivery with RN Shanda BumpsJessica also I was present during baptism with the Edwena Blowhaplain Pamela, by Orlan LeavensViria Alvarez Spanish Interpreter.

## 2016-08-19 NOTE — Progress Notes (Signed)
Called for interpretor as pt seems to have increased vaginal bleeding on pad that is currently in place. Per patient she had one that was "soaking" earlier today that she did not report and that has already been removed from the room. Pad in trash can seems to have a small amount of bright red blood. Patient states current pad was place two hours ago.  Asked patient to change pad now and interpretor will come back with me in one hour to check and see how much is there on the new pad.

## 2016-08-19 NOTE — Progress Notes (Signed)
Consulted with pt RN Jessica to acknowledge receipt of consult.  Consult note indicated that the patieShanda Bartlett requested baptism after her baby is born, but does not want a visit prior to baby's birth.  RN stated it was her understanding that the patient wanted baptism to take place after baby had been bathed and dressed, but she will further clarify when the transslator arrives.  Will continue to follow. Please page as further needs arise.  Maryanna ShapeAmanda M. Carley Hammedavee Lomax, M.Div. [facility] Chaplain Pager [phone] Office [phone]

## 2016-08-19 NOTE — Progress Notes (Signed)
This nurse delivered a Spanish Bible to patient.

## 2016-08-20 ENCOUNTER — Encounter (HOSPITAL_COMMUNITY)
Admission: AD | Disposition: A | Discharge status: Home / Self Care | Payer: Self-pay | Source: Ambulatory Visit | Attending: Obstetrics and Gynecology

## 2016-08-20 ENCOUNTER — Inpatient Hospital Stay (HOSPITAL_COMMUNITY): Payer: Medicaid Other | Admitting: Anesthesiology

## 2016-08-20 ENCOUNTER — Encounter (HOSPITAL_COMMUNITY): Payer: Self-pay

## 2016-08-20 DIAGNOSIS — O0339 Incomplete spontaneous abortion with other complications: Secondary | ICD-10-CM | POA: Diagnosis not present

## 2016-08-20 HISTORY — PX: DILITATION & CURRETTAGE/HYSTROSCOPY WITH ESSURE: SHX5573

## 2016-08-20 LAB — CBC
HCT: 28.5 % — ABNORMAL LOW (ref 36.0–46.0)
HEMOGLOBIN: 10.1 g/dL — AB (ref 12.0–15.0)
MCH: 31.6 pg (ref 26.0–34.0)
MCHC: 35.4 g/dL (ref 30.0–36.0)
MCV: 89.1 fL (ref 78.0–100.0)
Platelets: 213 10*3/uL (ref 150–400)
RBC: 3.2 MIL/uL — AB (ref 3.87–5.11)
RDW: 12.9 % (ref 11.5–15.5)
WBC: 6.9 10*3/uL (ref 4.0–10.5)

## 2016-08-20 LAB — URINALYSIS, ROUTINE W REFLEX MICROSCOPIC
Bilirubin Urine: NEGATIVE
Glucose, UA: NEGATIVE mg/dL
Ketones, ur: NEGATIVE mg/dL
NITRITE: NEGATIVE
Protein, ur: NEGATIVE mg/dL
SPECIFIC GRAVITY, URINE: 1.02 (ref 1.005–1.030)
pH: 7.5 (ref 5.0–8.0)

## 2016-08-20 LAB — URINE MICROSCOPIC-ADD ON

## 2016-08-20 SURGERY — DILATATION & CURETTAGE/HYSTEROSCOPY WITH ESSURE
Anesthesia: Monitor Anesthesia Care | Site: Vagina

## 2016-08-20 MED ORDER — METHYLERGONOVINE MALEATE 0.2 MG PO TABS: 0.2000 mg | ORAL_TABLET | Freq: Three times a day (TID) | ORAL | 0 refills | Status: DC | PRN

## 2016-08-20 MED ORDER — HYDROMORPHONE HCL 1 MG/ML IJ SOLN
0.2500 mg | INTRAMUSCULAR | Status: DC | PRN
Start: 2016-08-20 — End: 2016-08-20
  Administered 2016-08-20 (×2): 0.5 mg via INTRAVENOUS

## 2016-08-20 MED ORDER — METHYLERGONOVINE MALEATE 0.2 MG/ML IJ SOLN
INTRAMUSCULAR | Status: AC
Start: 2016-08-20 — End: 2016-08-20
  Filled 2016-08-20: qty 1

## 2016-08-20 MED ORDER — ONDANSETRON HCL 4 MG PO TABS: 8.0000 mg | ORAL_TABLET | Freq: Four times a day (QID) | ORAL | Status: DC | PRN

## 2016-08-20 MED ORDER — HYDROMORPHONE HCL 1 MG/ML IJ SOLN
INTRAMUSCULAR | Status: AC
  Administered 2016-08-20: 0.5 mg via INTRAVENOUS
  Filled 2016-08-20: qty 1

## 2016-08-20 MED ORDER — METHYLERGONOVINE MALEATE 0.2 MG/ML IJ SOLN
INTRAMUSCULAR | Status: DC | PRN
  Administered 2016-08-20: 0.2 mg via INTRAMUSCULAR

## 2016-08-20 MED ORDER — IBUPROFEN 800 MG PO TABS: 800.0000 mg | ORAL_TABLET | Freq: Three times a day (TID) | ORAL | Status: DC | PRN

## 2016-08-20 MED ORDER — CEFAZOLIN SODIUM-DEXTROSE 2-3 GM-% IV SOLR
INTRAVENOUS | Status: DC | PRN
  Administered 2016-08-20: 2 g via INTRAVENOUS

## 2016-08-20 MED ORDER — PROPOFOL 500 MG/50ML IV EMUL: INTRAVENOUS | Status: DC | PRN

## 2016-08-20 MED ORDER — OXYTOCIN 40 UNITS IN LACTATED RINGERS INFUSION - SIMPLE MED: 5.0000 [IU]/h | INTRAVENOUS | Status: DC

## 2016-08-20 MED ORDER — PROPOFOL 10 MG/ML IV BOLUS
INTRAVENOUS | Status: AC
  Filled 2016-08-20: qty 60

## 2016-08-20 MED ORDER — CEFAZOLIN SODIUM-DEXTROSE 2-4 GM/100ML-% IV SOLN
INTRAVENOUS | Status: AC
  Filled 2016-08-20: qty 100

## 2016-08-20 MED ORDER — LACTATED RINGERS IV SOLN
INTRAVENOUS | Status: DC
  Administered 2016-08-20: 07:00:00 via INTRAVENOUS

## 2016-08-20 MED ORDER — LACTATED RINGERS IV SOLN
INTRAVENOUS | Status: DC | PRN
  Administered 2016-08-20: 03:00:00 via INTRAVENOUS

## 2016-08-20 MED ORDER — ONDANSETRON HCL 4 MG/2ML IJ SOLN: 4.0000 mg | Freq: Four times a day (QID) | INTRAMUSCULAR | Status: DC | PRN

## 2016-08-20 MED ORDER — METHYLERGONOVINE MALEATE 0.2 MG PO TABS: 0.2000 mg | ORAL_TABLET | Freq: Three times a day (TID) | ORAL | 0 refills | Status: AC | PRN

## 2016-08-20 MED ORDER — KETOROLAC TROMETHAMINE 30 MG/ML IJ SOLN
INTRAMUSCULAR | Status: AC
  Filled 2016-08-20: qty 1

## 2016-08-20 MED ORDER — OXYTOCIN 40 UNITS IN LACTATED RINGERS INFUSION - SIMPLE MED
INTRAVENOUS | Status: DC | PRN
  Administered 2016-08-20: 40 mL via INTRAVENOUS

## 2016-08-20 MED ORDER — IBUPROFEN 800 MG PO TABS: 800.0000 mg | ORAL_TABLET | Freq: Three times a day (TID) | ORAL | 0 refills | Status: AC | PRN

## 2016-08-20 MED ORDER — OXYCODONE-ACETAMINOPHEN 5-325 MG PO TABS: 1.0000 | ORAL_TABLET | ORAL | Status: DC | PRN

## 2016-08-20 MED ORDER — PROPOFOL 500 MG/50ML IV EMUL
INTRAVENOUS | Status: DC | PRN
  Administered 2016-08-20 (×2): 50 mg via INTRAVENOUS
  Administered 2016-08-20: 150 mg via INTRAVENOUS
  Administered 2016-08-20: 10 mg via INTRAVENOUS
  Administered 2016-08-20: 50 mg via INTRAVENOUS

## 2016-08-20 MED ORDER — METHYLERGONOVINE MALEATE 0.2 MG PO TABS
0.2000 mg | ORAL_TABLET | ORAL | Status: DC
  Administered 2016-08-20 (×2): 0.2 mg via ORAL
  Filled 2016-08-20 (×2): qty 1

## 2016-08-20 MED ORDER — PROMETHAZINE HCL 25 MG/ML IJ SOLN: 6.2500 mg | INTRAMUSCULAR | Status: DC | PRN

## 2016-08-20 MED ORDER — KETOROLAC TROMETHAMINE 30 MG/ML IJ SOLN
INTRAMUSCULAR | Status: DC | PRN
  Administered 2016-08-20: 30 mg via INTRAVENOUS

## 2016-08-20 MED ORDER — MEPERIDINE HCL 25 MG/ML IJ SOLN: 6.2500 mg | INTRAMUSCULAR | Status: DC | PRN

## 2016-08-20 SURGICAL SUPPLY — 9 items
GLOVE BIO SURGEON STRL SZ7.5 (GLOVE) ×3 IMPLANT
GLOVE BIO SURGEON STRL SZ8 (GLOVE) ×3 IMPLANT
GLOVE BIOGEL PI IND STRL 7.5 (GLOVE) ×1 IMPLANT
GLOVE BIOGEL PI INDICATOR 7.5 (GLOVE) ×2
GOWN SURGICAL LARGE (GOWNS) ×6 IMPLANT
PACK VAGINAL MINOR WOMEN LF (CUSTOM PROCEDURE TRAY) ×3 IMPLANT
SET BERKELEY SUCTION TUBING (SUCTIONS) ×3 IMPLANT
TOWEL OR 17X24 6PK STRL BLUE (TOWEL DISPOSABLE) ×3 IMPLANT
VACURETTE 12 RIGID CVD (CANNULA) ×3 IMPLANT

## 2016-08-20 NOTE — Progress Notes (Signed)
Eda at bedside 0700.

## 2016-08-20 NOTE — Progress Notes (Signed)
Patient complaining of being cold and  she felt something in her vagina, found large clots on pad. waiting on on placenta to deliver. Dr. Despina HiddenEure notified and on way to floor to check patient.

## 2016-08-20 NOTE — Transfer of Care (Signed)
Immediate Anesthesia Transfer of Care Note  Patient: Noel ChristmasLourdes Noyola Burkley  Procedure(s) Performed: Procedure(s): DILATATION & CURETTAGE (N/A)  Patient Location: PACU  Anesthesia Type:MAC  Level of Consciousness: awake  Airway & Oxygen Therapy: Patient Spontanous Breathing  Post-op Assessment: Report given to RN and Post -op Vital signs reviewed and stable  Post vital signs: Reviewed and stable  Last Vitals:  Vitals:   08/19/16 2350 08/20/16 0209  BP: 113/67   Pulse: 98   Resp: 20   Temp: (!) 39.2 C 37.5 C    Last Pain:  Vitals:   08/20/16 0209  TempSrc: Oral  PainSc:          Complications: No apparent anesthesia complications

## 2016-08-20 NOTE — Progress Notes (Signed)
I spent time with pt and her SO offering support and helping them think through funeral home logistics and details.  They plan to use [employer] and have their baby, Isidore MoosMilagro, cremated.  They have 2 older children, ages 4316 and 694, who are coping okay for the time being.  I will let them know about resources for support both for their children and for them.   They do have a family member who will be assisting them when they go to [employer] to sign paper work.   Chaplain Dyanne CarrelKaty Oluwatosin Bracy, Bcc Pager, [phone] 1:13 PM    08/20/16 1300  Clinical Encounter Type  Visited With Patient;Patient and family together  Visit Type Spiritual support  Referral From Nurse  Spiritual Encounters  Spiritual Needs Grief support

## 2016-08-20 NOTE — OR Nursing (Signed)
This RN unable to palpate a fundus. L&D nurse called for assistance. She questions whether bladder is full. Bladder scan shows greater than 500 mls of urine. MD called and order received for in and out cath.

## 2016-08-20 NOTE — Anesthesia Preprocedure Evaluation (Signed)
Anesthesia Evaluation  Patient identified by MRN, date of birth, ID band Patient awake    Reviewed: Allergy & Precautions, H&P , NPO status , Patient's Chart, lab work & pertinent test results  Airway Mallampati: I  TM Distance: >3 FB Neck ROM: full    Dental no notable dental hx.    Pulmonary neg pulmonary ROS,    Pulmonary exam normal        Cardiovascular negative cardio ROS Normal cardiovascular exam     Neuro/Psych negative neurological ROS  negative psych ROS   GI/Hepatic negative GI ROS, Neg liver ROS,   Endo/Other  negative endocrine ROS  Renal/GU negative Renal ROS     Musculoskeletal   Abdominal Normal abdominal exam  (+)   Peds  Hematology negative hematology ROS (+)   Anesthesia Other Findings   Reproductive/Obstetrics (+) Pregnancy                             Anesthesia Physical Anesthesia Plan  ASA: II  Anesthesia Plan: MAC   Post-op Pain Management:    Induction: Intravenous  Airway Management Planned:   Additional Equipment:   Intra-op Plan:   Post-operative Plan:   Informed Consent: I have reviewed the patients History and Physical, chart, labs and discussed the procedure including the risks, benefits and alternatives for the proposed anesthesia with the patient or authorized representative who has indicated his/her understanding and acceptance.     Plan Discussed with: CRNA and Surgeon  Anesthesia Plan Comments:         Anesthesia Quick Evaluation

## 2016-08-20 NOTE — Discharge Instructions (Signed)
Dilatacin y curetaje o curetaje por aspiracin, cuidados posteriores (Dilation and Curettage or Vacuum Curettage, Care After) Siga estas instrucciones durante las prximas semanas. Estas indicaciones le proporcionan informacin acerca de cmo deber cuidarse despus del procedimiento. El mdico tambin podr darle instrucciones ms especficas. El tratamiento se ha planificado de acuerdo a las prcticas mdicas actuales, pero a veces se producen problemas. Comunquese con el mdico si tiene algn problema o tiene dudas despus del procedimiento. QU ESPERAR DESPUS DEL PROCEDIMIENTO Despus del procedimiento, es tpico tener clicos. Pueden durar de 2das a 2semanas despus del procedimiento. INSTRUCCIONES PARA EL CUIDADO EN EL HOGAR   No conduzca durante 24horas.  Espere una semana antes de Target Corporationretomar las actividades intensas.  Tmese la Chubb Corporationtemperatura dos veces al da, durante 4 das y Engineering geologistregstrela. Si tiene fiebre, informe estas temperaturas a su mdico.  Advertising account plannervite permanecer de pie durante largos perodos.  Evite levantar, tironear o empujar objetos pesados. No levante ningn objeto que pese ms de 10 libras (4,5 kg).  Solo suba escaleras una o dos veces por da.  Tome con frecuencia momentos de descanso.  Puede retomar su dieta habitual.  Beba suficiente lquido para mantener la orina clara o de color amarillo plido.  La funcin intestinal normal se restablecer. Si est estreida, podr:  Tomar un laxante suave con autorizacin de su mdico.  Agregar frutas y salvado a su dieta.  Beber ms lquidos.  Tome Museum/gallery curatorduchas en lugar de baos de inmersin hasta que el mdico la autorice.  No practique natacin ni utilice el jacuzzi hasta que el mdico la autorice.  Pdale a alguna persona que permanezca con usted durante las primeras 24 a 48 horas, especialmente si le han administrado anestesia general.  No se haga duchas vaginales, no use tampones ni tenga sexo (relaciones sexuales) durante  las 2semanas siguientes al procedimiento.  Tome solo medicamentos de venta libre o recetados, segn las indicaciones del mdico. No tome aspirina. Puede ocasionar hemorragias.  Concurra a las consultas de control con su mdico segn las indicaciones. SOLICITE ATENCIN MDICA SI:   Siente clicos o el dolor no se Chief of Staffalivia con la medicacin.  Siente dolor abdominal que no parece estar relacionado con el rea en que senta los clicos y Chief Technology Officerel dolor anteriormente.  Brett Fairybserva una secrecin vaginal con mal olor.  Tiene una erupcin cutnea.  Tiene problemas con algn medicamento. SOLICITE ATENCIN MDICA DE INMEDIATO SI:   Tiene una hemorragia ms abundante que un perodo menstrual normal.  Tiene fiebre.  Siente dolor en el pecho.  Le falta el aire.  Se siente mareada o siente como si fuera a desmayarse.  Se desmaya.  Siente dolor en la zona de los breteles de los hombros.  Tiene una hemorragia vaginal abundante, con o sin cogulos sanguneos. ASEGRESE DE QUE:   Comprende estas instrucciones.  Controlar su afeccin.  Recibir ayuda de inmediato si no mejora o si empeora.   Esta informacin no tiene Theme park managercomo fin reemplazar el consejo del mdico. Asegrese de hacerle al mdico cualquier pregunta que tenga.   Document Released: 07/22/2005 Document Revised: 10/17/2013 Elsevier Interactive Patient Education 2016 [employer]vier [employer]  Parto vaginal, cuidados posteriores (Vaginal Delivery, Care After) Siga estas instrucciones durante las prximas semanas. Estas indicaciones le proporcionan informacin general acerca de cmo deber cuidarse despus del parto. El mdico tambin podr darle instrucciones ms especficas. El tratamiento ha sido planificado segn las prcticas mdicas actuales, pero en algunos casos pueden ocurrir problemas. Comunquese con el mdico si tiene algn problema o tiene dudas despus  del parto. QU ESPERAR DESPUS DEL PARTO Despus del parto, es habitual tener los  siguientes sntomas:   Puede sentir Radiographer, therapeutic zona vaginal durante [employer]. Si le hicieron una incisin o tuvo un desgarro vaginal, es probable que la zona siga estando dolorida con la palpacin durante varias semanas.  Despus de un parto vaginal, puede sentirse muy fatigada.  Puede tener sangrado vaginal y flujo que primero ser rojo y luego se convertir en rosa, Carter, y finalmente blanco. Por lo general, esto dura alrededor de 6semanas en total.  La combinacin de haber perdido al beb con el cambio hormonal posterior al parto puede hacer que se sienta muy triste. Tambin puede sufrir cambios emocionales muy rpidos. Algunas de las B[employer]on[employer] and Company personas notan con frecuencia despus de la prdida incluyen las siguientes:  Enojo.  Negacin.  Remordimiento.  Tristeza.  Depresin.  Dolor.  Problemas de pareja. INSTRUCCIONES PARA EL CUIDADO EN EL HOGAR   Considere buscar ayuda para superar la prdida. Algunas de las formas de apoyo que podra considerar incluyen a su lder religioso, amigos, familiares, un psicoterapeuta o un grupo de apoyo por duelo.  Tome los medicamentos solamente como se lo haya indicado el mdico.  Contine con un adecuado cuidado perineal. El buen cuidado perineal incluye lo siguiente:  Higienizarse de Camera operator.  Mantener la zona perineal limpia.  No use tampones ni se haga duchas vaginales hasta que el mdico la autorice.  Dchese, lvese el cabello y tome baos de inmersin segn las indicaciones de su mdico.  Utilice un sostn que le ajuste bien y que brinde buen soporte a sus Building control surveyor.  Beba suficiente lquido para Photographer orina clara o de color amarillo plido.  Consuma alimentos saludables.  Consuma a diario alimentos ricos en fibra, como cereales y panes integrales, arroz integral, frijoles, y frutas y Programmer, systems. Estos alimentos pueden ayudarla a prevenir o Educational psychologist.  Siga las  indicaciones del mdico acerca de retomar actividades como subir escaleras, Science writer, levantar objetos, hacer ejercicio o viajar.  Aumente sus actividades gradualmente.  Hable con el mdico acerca de reanudar la actividad sexual. Esto depender del riesgo de infeccin, de la velocidad de la curacin, de su comodidad y de su deseo de reanudarla.  Trate de que alguien la ayude con las actividades del hogar al menos durante algunos das despus de salir del hospital.  Descanse todo lo que pueda.  Cumpla con todas las visitas de control programadas para despus del parto. Es muy importante asistir a todas las Merchant navy officer de seguimiento. En estas citas, el mdico la controlar para asegurarse de que est sanando fsica y emocionalmente.  No beba alcohol, especialmente si est tomando analgsicos.  No consuma ningn producto que contenga tabaco, como cigarrillos, tabaco de Theatre manager o Administrator, Civil Service. Si necesita ayuda para dejar de consumir tabaco, hable con el mdico.  No consuma drogas. SOLICITE ATENCIN MDICA SI:   Se siente triste o deprimida.  Tiene pensamientos acerca de lastimarse.  Tiene problemas para comer o dormir.  No puede disfrutar de cosas de la vida que antes s disfrutaba.  Elimina cogulos grandes por la vagina. Guarde algunos cogulos para mostrarle al mdico.  Tiene flujo con mal olor que proviene de la vagina.  Tiene problemas para orinar.  Orina con frecuencia.  Siente dolor al [employer]oco[employer]illips.  Nota un cambio en sus movimientos intestinales.  Aumenta el enrojecimiento, el dolor o la hinchazn en la zona de la incisin o Art therapist  vaginal.  Tiene pus que drena de la incisin o del desgarro vaginal.  La incisin o el desgarro vaginal se abren.  Sus [employer] duelen, estn duras o enrojecidas.  Sufre un dolor intenso de [place].  Tiene visin borrosa o ve manchas.  Se siente mareada o sufre un desmayo.  Tiene una erupcin cutnea.  Tiene  nuseas o vmitos.  No ha tenido el periodo menstrual en las ltimas 12semanas despus del parto.  Tiene fiebre. SOLICITE ATENCIN MDICA DE INMEDIATO SI:   Le preocupa la posibilidad de hacerse dao o est pensando en suicidarse.  Tiene dolor persistente.  Siente dolor en el pecho.  Le falta el aire.  Se desmaya.  Siente dolor en la pierna.  Siente Physiological scientist.  El sangrado vaginal satura dos o ms apsitos en 1 hora. ASEGRESE DE QUE:  Comprende estas instrucciones.  Controlar su afeccin.  Recibir ayuda de inmediato si no mejora o si empeora.   Esta informacin no tiene Theme park manager el consejo del mdico. Asegrese de hacerle al mdico cualquier pregunta que tenga.   Document Released: 02/26/2014 Elsevier Interactive Patient Education [employer]

## 2016-08-20 NOTE — Anesthesia Procedure Notes (Signed)
Procedure Name: MAC Date/Time: 08/20/2016 2:58 AM Performed by: Janeann Paisley, Jannet AskewHARLESETTA Bartlett Patient Re-evaluated:Patient Re-evaluated prior to inductionOxygen Delivery Method: Circle system utilized and Nasal cannula Preoxygenation: Pre-oxygenation with 100% oxygen Intubation Type: IV induction

## 2016-08-20 NOTE — Discharge Summary (Signed)
OB Discharge Summary     Patient Name: Nancy Bartlett DOB: 1986-03-06 MRN: [id number]  Date of admission: 08/18/2016 Delivering MD: Lorne Skeens   Date of discharge: 08/20/2016  Admitting diagnosis: 19.6WKS RUPTURED Retained Products Intrauterine pregnancy: [redacted]w[redacted]d     Secondary diagnosis:  Active Problems:   Threatened abortion   Anhydramnios in second trimester   Retained placenta  Additional problems: None (see above)     Discharge diagnosis: Non-viable spontaneous abortion; retained placenta s/p manual removal and suction D&C                                                                                                Post partum procedures:curettage   Augmentation: None  Complications: Retained placenta  Hospital course:  Onset of Labor With Vaginal Delivery     30 y.o. yo G3P2002 at [redacted]w[redacted]d was admitted in Latent Labor with PPROM on 08/18/2016. Patient had an uncomplicated labor course as follows:  Membrane Rupture Time/Date:   ,08/18/2016   Intrapartum Procedures: Episiotomy: None [1]                                         Lacerations:  None [1]  Patient had a delivery of a Non Viable infant.  08/19/2016  Information for the patient's newborn:  Vastie, Douty [[id number]]  Delivery Method: Vaginal, Spontaneous Delivery (Filed from Delivery Summary)  Following delivery, patient had to have a manual extraction of placenta, and then a suction D&C for retained products of conception.   Patient had an uncomplicated postpartum course after suction D&C for retained products of conception.  She is ambulating, tolerating a regular diet, passing flatus, and urinating well. Patient is discharged home in stable condition on 08/31/16.    Physical exam Vitals:   08/20/16 0900 08/20/16 1000 08/20/16 1100 08/20/16 1200  BP: 106/71 104/70 90/62 103/60  Pulse: 78 84 77 81  Resp: 18 18 18 18   Temp: 97.4 F (36.3 C) 97.6 F (36.4 C) 97.5 F (36.4 C)  99.1 F (37.3 C)  TempSrc: Oral Oral Oral Oral  SpO2: 100%  98% 99%   General: alert, cooperative and no distress Lochia: appropriate Uterine Fundus: firm Incision: N/A DVT Evaluation: No evidence of DVT seen on physical exam. Negative Homan's sign. No cords or calf tenderness. Labs: Lab Results  Component Value Date   WBC 6.9 08/20/2016   HGB 10.1 (L) 08/20/2016   HCT 28.5 (L) 08/20/2016   MCV 89.1 08/20/2016   PLT 213 08/20/2016   No flowsheet data found.  Discharge instruction: per After Visit Summary and "Baby and Me Booklet".  After visit meds:    Medication List    TAKE these medications   acetaminophen 500 MG tablet Commonly known as:  TYLENOL Take 1,000 mg by mouth every 6 (six) hours as needed for mild pain, moderate pain or headache.   amoxicillin 500 MG capsule Commonly known as:  AMOXIL Take 500 mg by mouth 3 (three) times daily.  ibuprofen 800 MG tablet Commonly known as:  ADVIL,MOTRIN Take 1 tablet (800 mg total) by mouth every 8 (eight) hours as needed for mild pain, moderate pain or cramping.   Prenatal Vitamin 27-0.8 MG Tabs Take 1 tablet by mouth daily.       Diet: routine diet  Activity: Advance as tolerated. Pelvic rest for 6 weeks.   Outpatient follow up:6 weeks Follow up Appt:No future appointments. Follow up Visit:No Follow-up on file.  Postpartum contraception: Undecided as she still desires pregnancy.  Newborn Data: Live born female  Birth Weight: 9 oz (255 g) APGAR: 0, 0  Baby Feeding: N/A Disposition:morgue   08/20/2016 Jen MowElizabeth Kennis Buell, DO

## 2016-08-20 NOTE — Op Note (Signed)
Preoperative diagnosis:  20 week pregnancy loss with retained placenta  Postoperative diagnosis:  Same as above  Procedure: Manual removal of placenta with suction and sharp curettage of the uterus  Surgeon:EURE,LUTHER H, MD  Anesthesia:MAC   Findings: The patient delivered spontaneously about 1800.  The placenta remained intact and undelivered.  She had minimal bleeding.  I then waited 4 hours and examined the patient and the placenta was still attached to the uterus without any evidence of placental separation.  I then gave the patient 800 g orally of Cytotec.  Within an hour the patient began having some uterine activity and some bleeding I examined the patient then at that time and the placenta again remained in utero without evidence of significant separation.  I then waited another 2 hours and was called to the room for increased bleeding and on exam the placenta was still firmly attached to the uterus.  As a result at this time I decided to proceed with a manual removal with a suction and sharp uterine curettage if needed.  Description of operation: Patient was taken to the operating room where she was placed in the supine position.  She underwent IV sedation with airway support.  She was placed in dorsal lithotomy position and prepped with surgical care.  I did a manual removal of the placenta but felt like there was still some placental left behind.  It was densely adherent and there was no good plane of separation of the placenta from the uterus.  I then used a #12 curet and made four with the suction.  This point I felt I removed all the tissue in today gentle sharp curettage with a banjo curet and got no further tissue.  The patient was given IM methargen and IV Pitocin.  Her bleeding was appropriate and her uterus was contracting down at the end of the case.  Total blood loss for the procedures approximately 250 cc.  The patient received 2 g of Ancef prophylactically 30 mg of Toradol IV as  well as the Methergine and Pitocin.  She was taken recovery in good stable condition all counts were correct  Lazaro ArmsEURE,LUTHER H, MD 08/20/2016 3:51 AM

## 2016-08-20 NOTE — Progress Notes (Signed)
MD has reviewed UA, ok to go home.

## 2016-08-20 NOTE — Progress Notes (Signed)
Assessed pt at 0130 and found significant bleeding estimated to be 565ml after weighing pad, Dr Despina HiddenEure notified, upon Dr Forestine ChuteEure's assessment the decision to take the patient to OR to remove the remaining placenta was made. House Coverage was notified. Upon call from OR team stating readiness for the pt, escorted pt to OR with Lasandra BeechMarcella Hudgsens RN and pts husband, interpretor used through the IPAD to answer questions prior to departing from pt. Vitals Stable 110/59, p98, r18, t99.5,100% on RA. CHG wipes used to cleans pt front and back, SCD's on, Hugs Blanket ON prior to transport.

## 2016-08-21 ENCOUNTER — Encounter (HOSPITAL_COMMUNITY): Payer: Self-pay | Admitting: Obstetrics & Gynecology

## 2016-08-21 LAB — URINE CULTURE: CULTURE: NO GROWTH

## 2016-08-21 NOTE — Anesthesia Postprocedure Evaluation (Signed)
Anesthesia Post Note  Patient: Noel ChristmasLourdes Noyola Bruski  Procedure(s) Performed: Procedure(s) (LRB): DILATATION & EVACUATION (N/A)  Patient location during evaluation: PACU Anesthesia Type: MAC Level of consciousness: sedated and awake Pain management: pain level controlled Vital Signs Assessment: post-procedure vital signs reviewed and stable Respiratory status: spontaneous breathing Cardiovascular status: stable Postop Assessment: no signs of nausea or vomiting Anesthetic complications: no     Last Vitals:  Vitals:   08/20/16 1100 08/20/16 1200  BP: 90/62 103/60  Pulse: 77 81  Resp: 18 18  Temp: 36.4 C 37.3 C    Last Pain:  Vitals:   08/20/16 1200  TempSrc: Oral  PainSc: 0-No pain   Pain Goal:                 Trevious Rampey JR,JOHN Jamez Ambrocio

## 2018-01-13 IMAGING — US US OB TRANSVAGINAL
1 series · 15 of 28 positions shown · non-contrast
Comparison: None for this pregnancy

CLINICAL DATA: 30-year-old pregnant female with vaginal bleeding

EXAM:
OBSTETRIC <14 WK US AND TRANSVAGINAL OB US
TECHNIQUE: Both transabdominal and transvaginal ultrasound examinations were
performed for complete evaluation of the gestation as well as the
maternal uterus, adnexal regions, and pelvic cul-de-sac.
Transvaginal technique was performed to assess early pregnancy.

[Series 1: us ob transvaginal · 76 acquisitions, 15 frames shown]
[im 1/76]
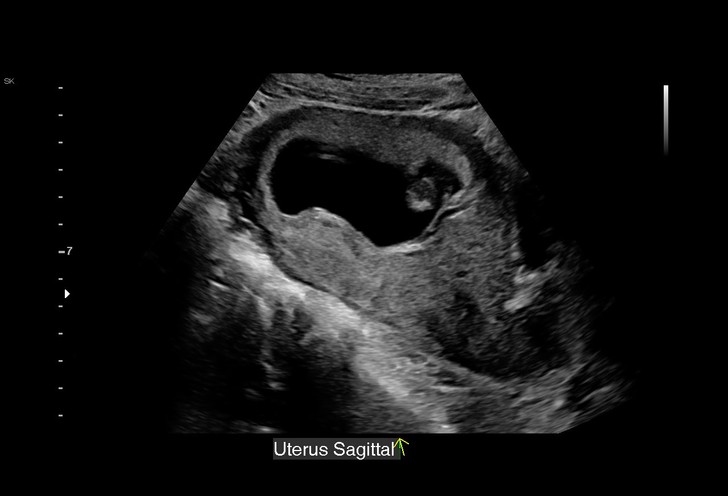
[im 6/76]
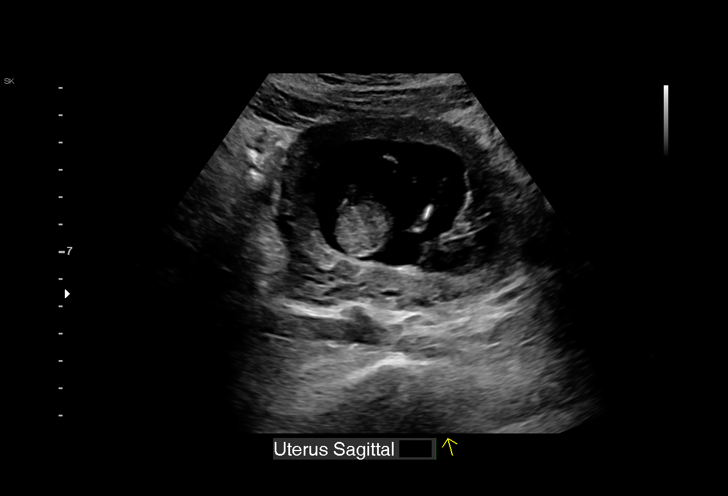
[im 12/76]
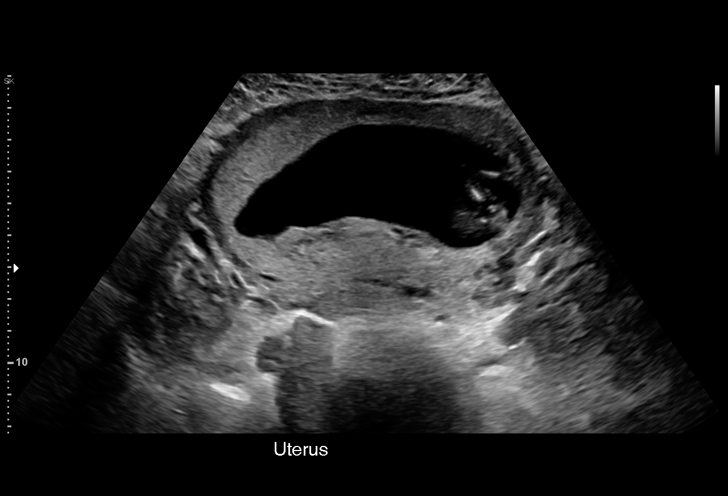
[im 17/76]
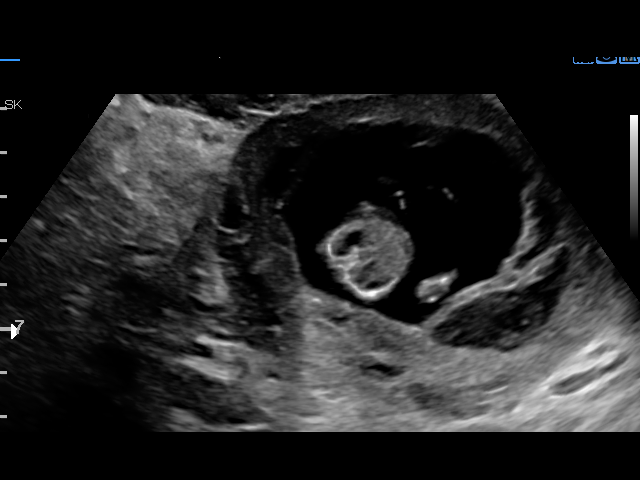
[im 23/76]
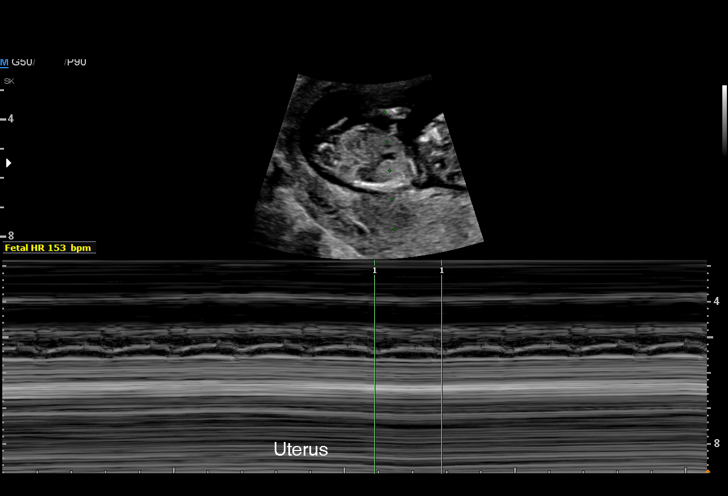
[im 28/76]
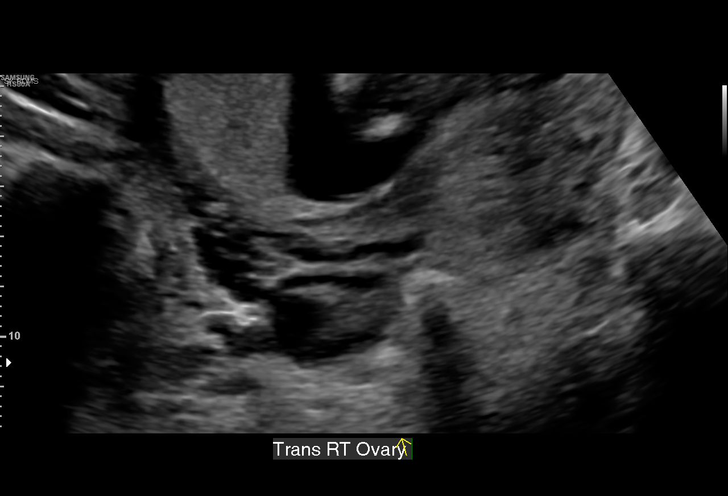
[im 34/76]
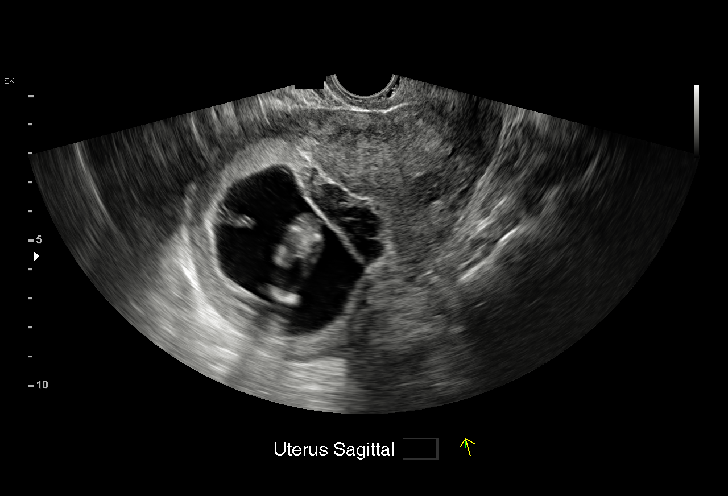
[im 39/76]
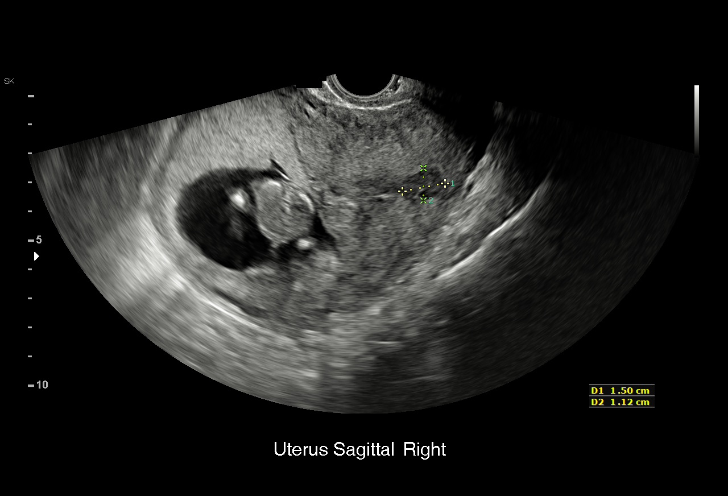
[im 42/76]
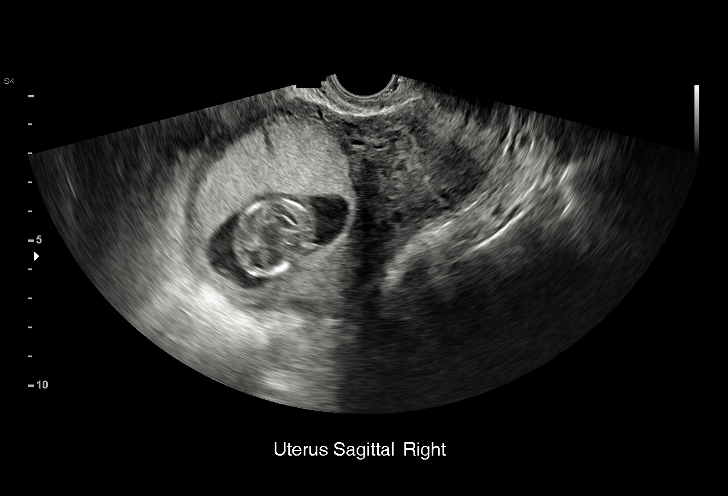
[im 48/76]
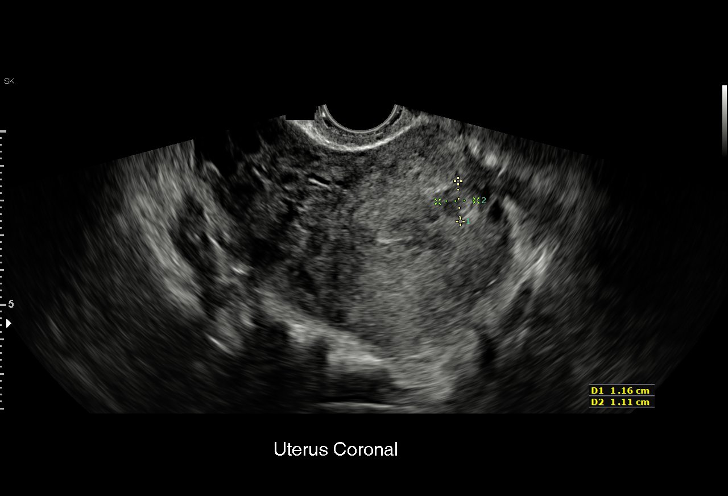
[im 53/76]
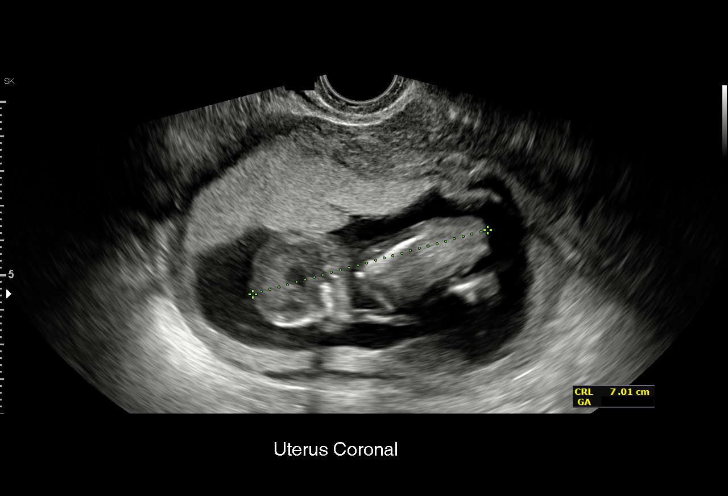
[im 59/76]
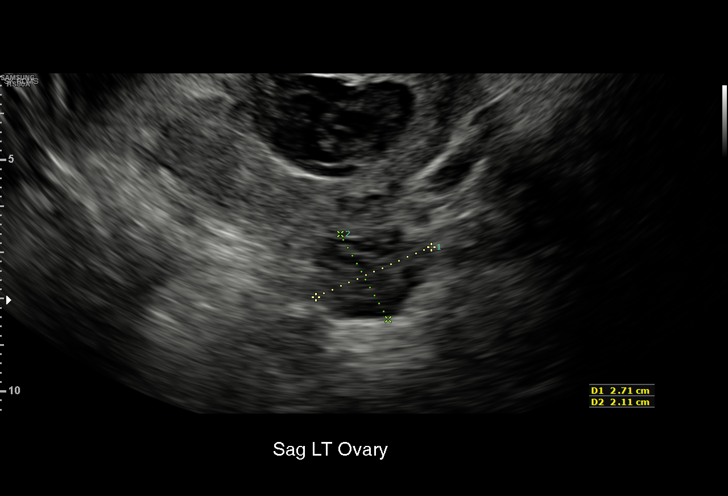
[im 64/76]
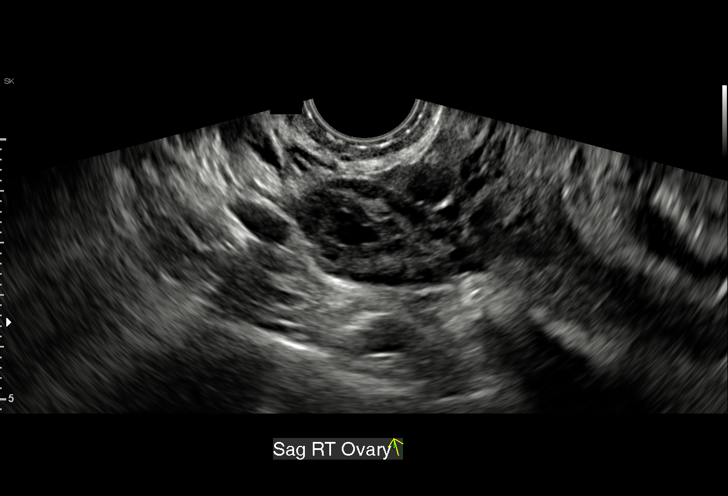
[im 70/76]
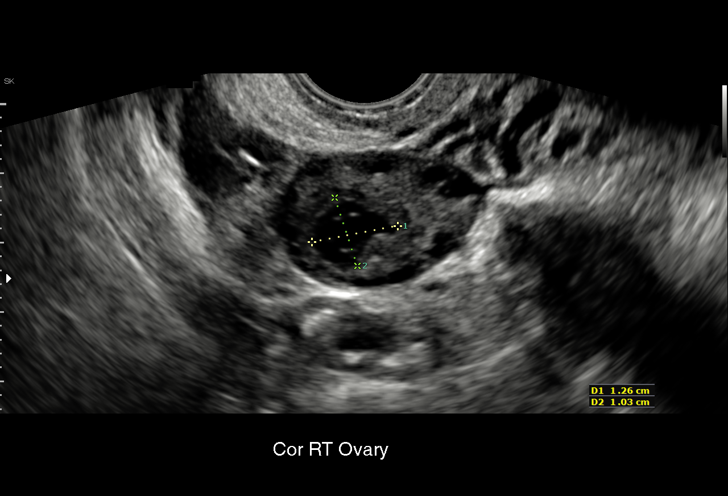
[im 76/76]
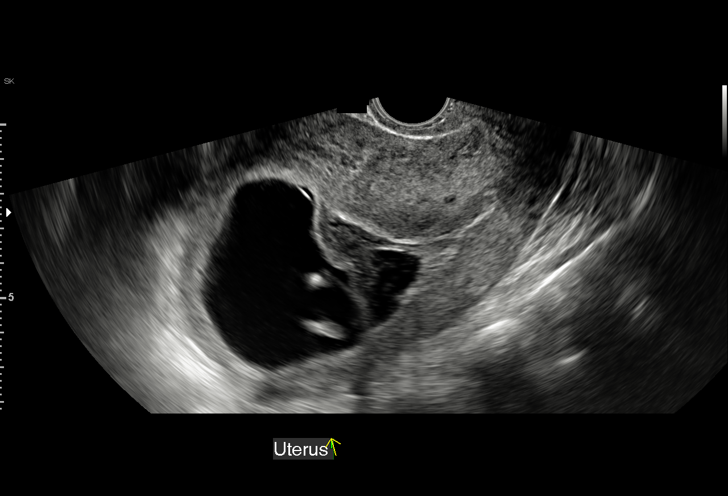

[15 of 28 positions shown; findings below may reference images not displayed]

FINDINGS: Intrauterine gestational sac: Single intrauterine gestational sac

Yolk sac:  Not seen

Embryo:  Present

Cardiac Activity: Detected

Heart Rate: 153  bpm

CRL:  7  mm   13 w   0 d                  US EDC: 01/06/2017

Subchorionic hemorrhage: There is a 3.4 x 1.8 x 3.2 cm subchorionic
hemorrhage to the left and inferior aspect of the gestational sac.

Maternal uterus/adnexae: There are 2 intramural fibroids in the
inferior aspect of the uterus measuring up to 1.5 x 1.1 cm. The
maternal ovaries appear unremarkable. There is a 1.3 x 1.0 x 1.2 cm
complex cyst or corpus luteum in the right ovary.
IMPRESSION: Single live intrauterine pregnancy with an estimated gestational age
of 13 weeks, 0 days.

Moderate subchorionic hemorrhage.  Follow-up recommended.

## 2018-03-02 IMAGING — US US MFM OB DETAIL+14 WK
1 series · 14 of 14 positions shown · non-contrast
Comparison: none

[Series 1: us mfm ob detail+14 wk · 14 of 14 slices shown]
[im 1/14]
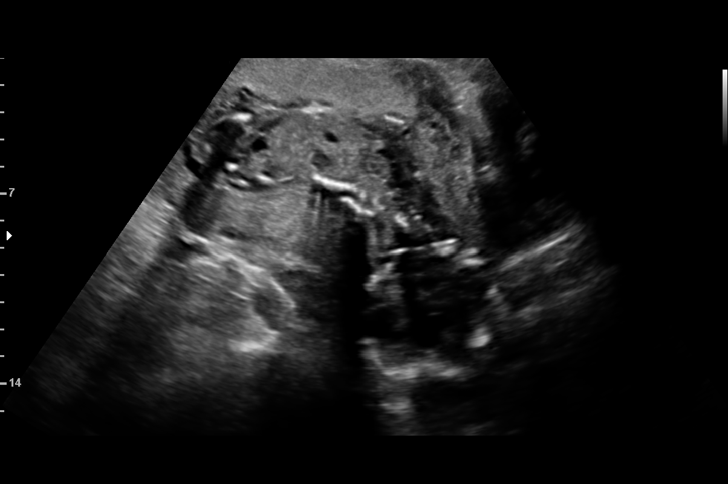
[im 2/14]
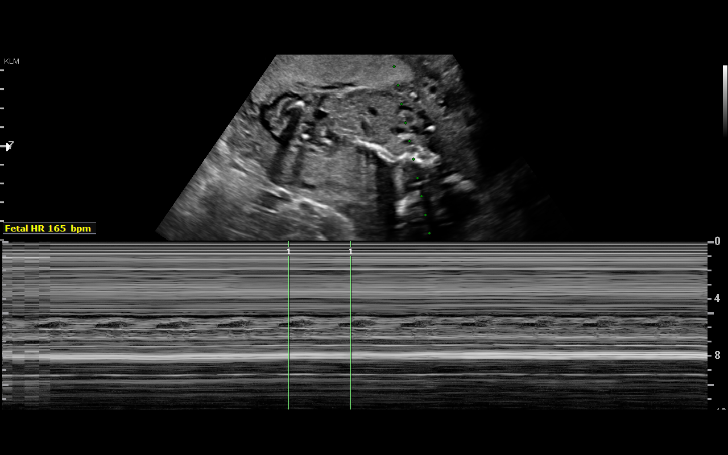
[im 3/14]
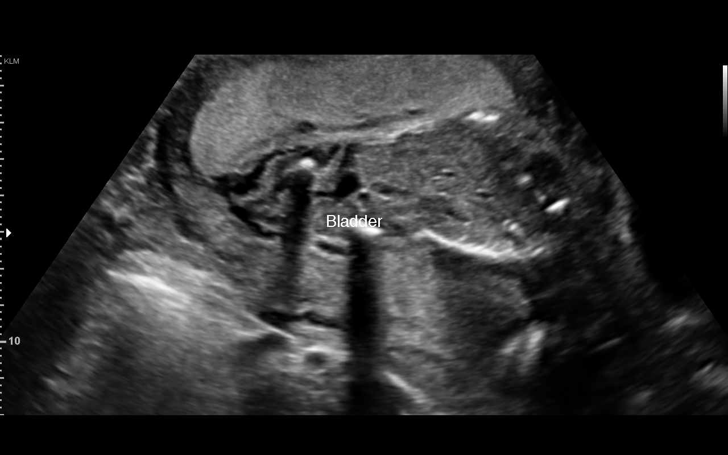
[im 4/14]
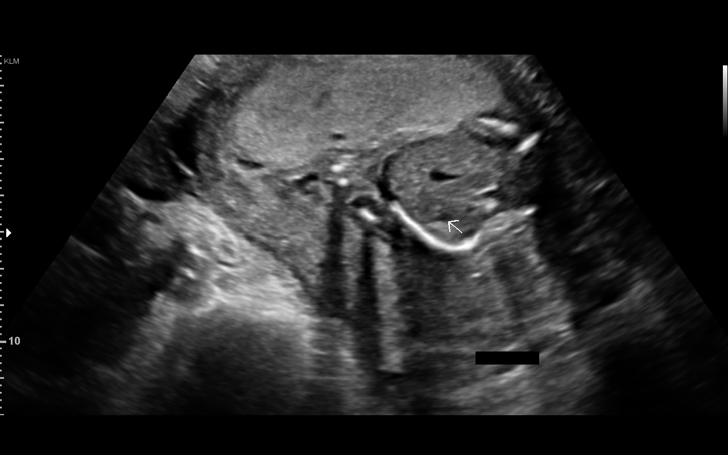
[im 5/14]
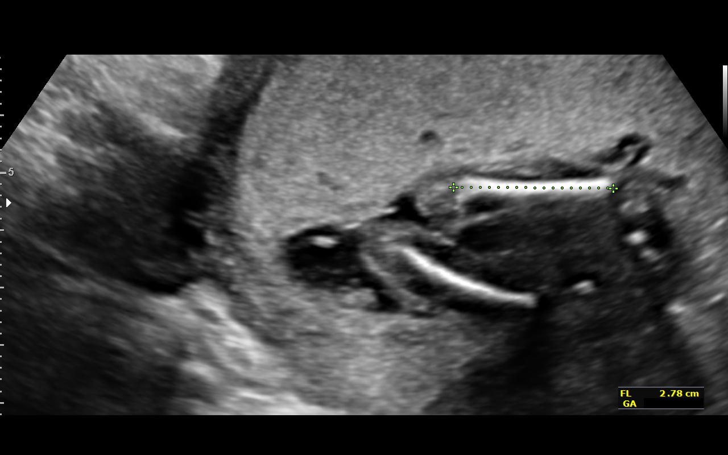
[im 6/14]
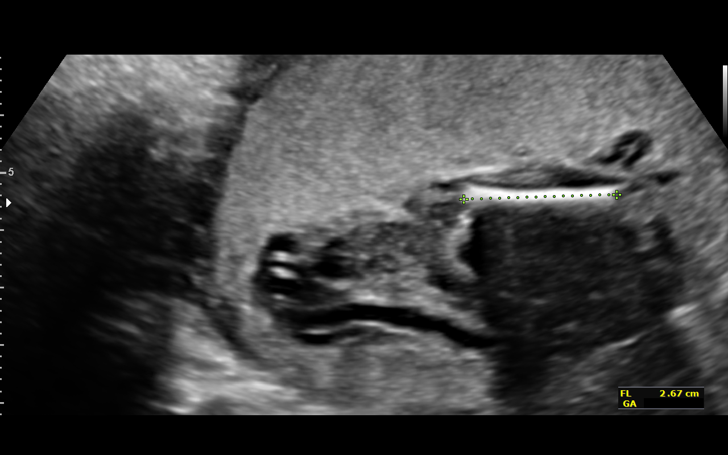
[im 7/14]
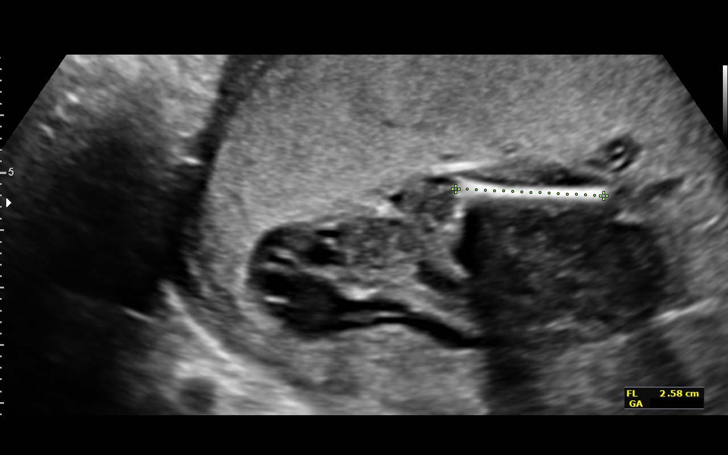
[im 8/14]
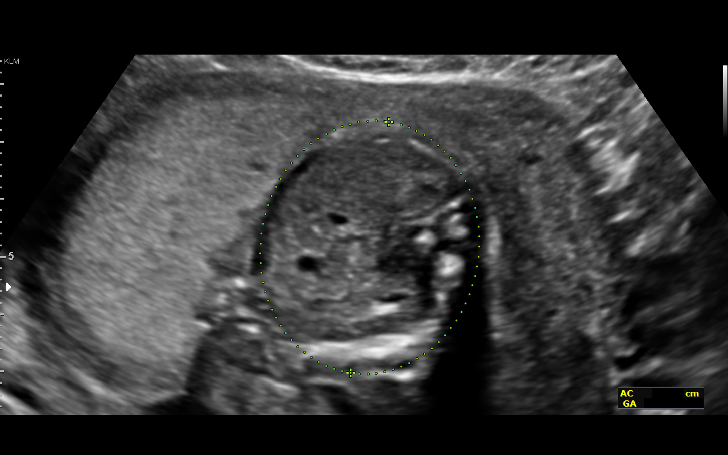
[im 9/14]
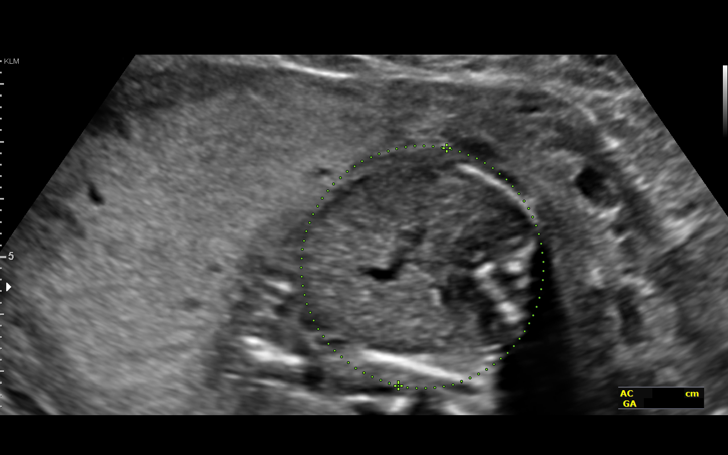
[im 10/14]
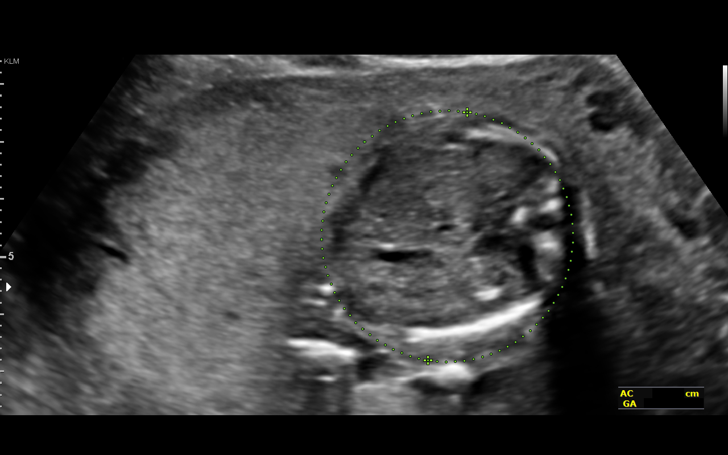
[im 11/14]
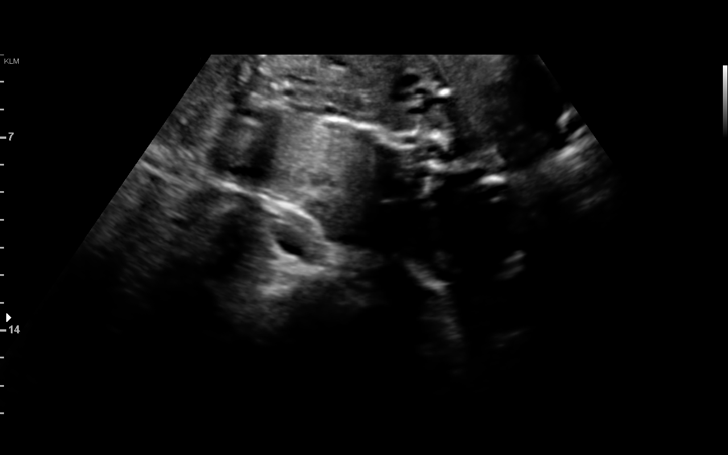
[im 12/14]
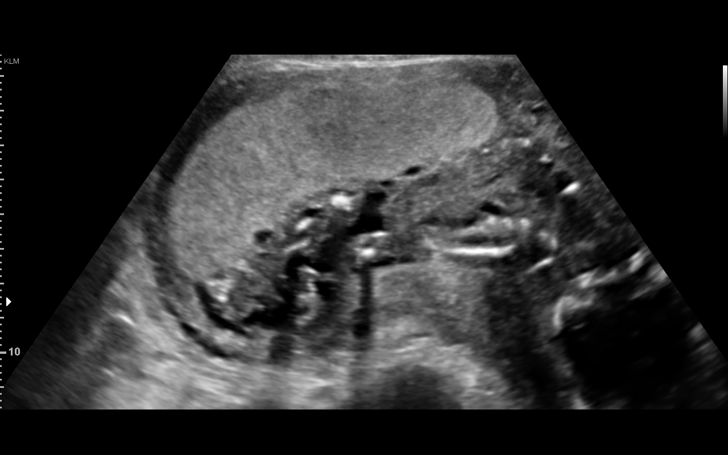
[im 13/14]
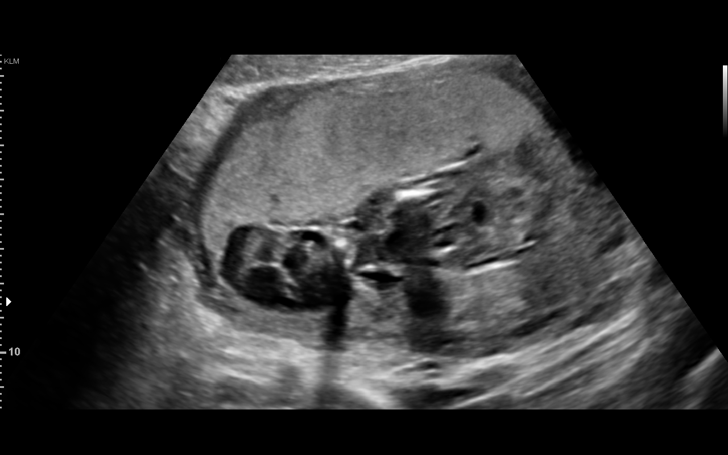
[im 14/14]
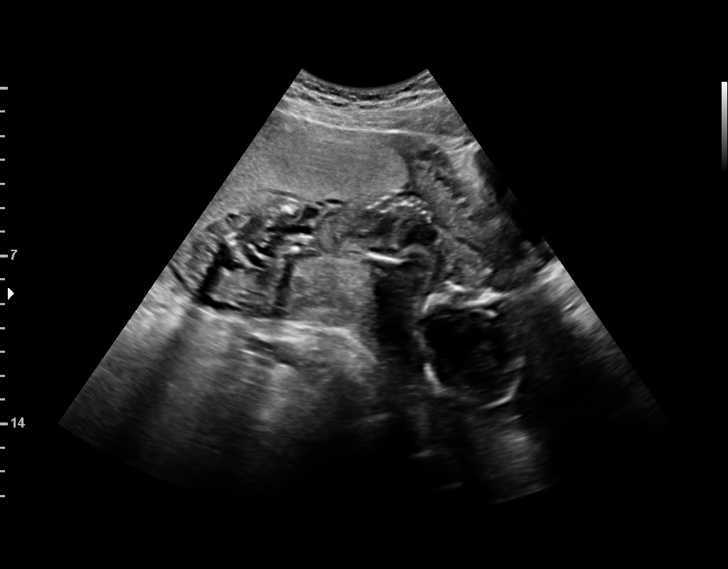

[14 of 14 positions shown; findings below may reference images not displayed]

[facility]

[HOSPITAL][HOSPITAL]

1  DINA DINKA MIRJACIC               [id number]      [id number]     [id number]
Indications

19 weeks gestation of pregnancy
Premature rupture of membranes - leaking
fluid
Encounter for antenatal screening for
malformations
OB History

Gravidity:    3         Term:   2        Prem:   0        SAB:   0
TOP:          0       Ectopic:  0        Living: 2
Fetal Evaluation

Num Of Fetuses:     1
Fetal Heart         165
Rate(bpm):
Cardiac Activity:   Observed
Presentation:       Cephalic
Placenta:           Anterior, above cervical os
P. Cord Insertion:  Not well visualized

Amniotic Fluid
AFI FV:      Anhydramnios
Biometry

AC:      132.8  mm     G. Age:  18w 5d         15  %
FL:       26.8  mm     G. Age:  18w 1d          4  %    FL/AC:      20.2   %    20 - 24
Gestational Age
LMP:           34w 0d        Date:  12/24/15                 EDD:   09/29/16
U/S Today:     18w 3d                                        EDD:   01/16/17
Best:          19w 6d     Det. By:  Early Ultrasound         EDD:   01/06/17
(07/01/16)
Anatomy

Stomach:               Appears normal, left   Bladder:                Appears normal
sided
Cervix Uterus Adnexa

Cervix
Not adaquately visualized
Impression

Single IUP at 19w 6d by early ultrasound
Anhydramnios
A bladder ans small stomach bubble are appreciated
Limited views of the fetal anatomy were obtained due to
oligohydramnios
No gross anomalies noted
The fetal head appears to be in the cervix
Anterior placenta without previa
Recommendations

The patient was sent to [facility] for further evaluation - concerns
of delivering imminently.
See separate consult note

Discussed with Dr. Dom
